# Patient Record
Sex: Female | Born: 1950 | Race: White | Hispanic: No | Marital: Married | Smoking: Current every day smoker
Health system: Southern US, Community
[De-identification: ages and names within clinical notes are randomized; demographics above are authoritative.]

## PROBLEM LIST (undated history)

## (undated) DIAGNOSIS — F419 Anxiety disorder, unspecified: Secondary | ICD-10-CM

## (undated) DIAGNOSIS — M19049 Primary osteoarthritis, unspecified hand: Secondary | ICD-10-CM

## (undated) DIAGNOSIS — I700 Atherosclerosis of aorta: Secondary | ICD-10-CM

## (undated) DIAGNOSIS — E039 Hypothyroidism, unspecified: Secondary | ICD-10-CM

## (undated) DIAGNOSIS — J449 Chronic obstructive pulmonary disease, unspecified: Secondary | ICD-10-CM

## (undated) DIAGNOSIS — I2510 Atherosclerotic heart disease of native coronary artery without angina pectoris: Secondary | ICD-10-CM

## (undated) DIAGNOSIS — F39 Unspecified mood [affective] disorder: Secondary | ICD-10-CM

## (undated) DIAGNOSIS — M810 Age-related osteoporosis without current pathological fracture: Secondary | ICD-10-CM

## (undated) DIAGNOSIS — Z720 Tobacco use: Secondary | ICD-10-CM

## (undated) DIAGNOSIS — M1990 Unspecified osteoarthritis, unspecified site: Secondary | ICD-10-CM

## (undated) DIAGNOSIS — M5430 Sciatica, unspecified side: Secondary | ICD-10-CM

## (undated) DIAGNOSIS — E063 Autoimmune thyroiditis: Secondary | ICD-10-CM

## (undated) HISTORY — DX: Chronic obstructive pulmonary disease, unspecified: J44.9

## (undated) HISTORY — DX: Age-related osteoporosis without current pathological fracture: M81.0

## (undated) HISTORY — DX: Atherosclerotic heart disease of native coronary artery without angina pectoris: I25.10

## (undated) HISTORY — DX: Unspecified osteoarthritis, unspecified site: M19.90

## (undated) HISTORY — DX: Sciatica, unspecified side: M54.30

## (undated) HISTORY — PX: TOTAL THYROIDECTOMY: SHX2547

## (undated) HISTORY — PX: ABDOMINAL HYSTERECTOMY: SHX81

## (undated) HISTORY — DX: Tobacco use: Z72.0

## (undated) HISTORY — DX: Autoimmune thyroiditis: E06.3

## (undated) HISTORY — DX: Anxiety disorder, unspecified: F41.9

## (undated) HISTORY — DX: Unspecified mood (affective) disorder: F39

## (undated) HISTORY — DX: Atherosclerosis of aorta: I70.0

## (undated) HISTORY — DX: Primary osteoarthritis, unspecified hand: M19.049

## (undated) HISTORY — PX: CHOLECYSTECTOMY: SHX55

## (undated) HISTORY — DX: Hypothyroidism, unspecified: E03.9

## (undated) HISTORY — PX: BREAST CYST EXCISION: SHX579

## (undated) HISTORY — PX: CERVICAL DISC SURGERY: SHX588

---

## 2000-07-16 ENCOUNTER — Ambulatory Visit (HOSPITAL_COMMUNITY): Admission: RE | Admit: 2000-07-16 | Discharge: 2000-07-16 | Payer: Self-pay

## 2004-06-10 ENCOUNTER — Emergency Department (HOSPITAL_COMMUNITY): Admission: EM | Admit: 2004-06-10 | Discharge: 2004-06-10 | Payer: Self-pay | Admitting: *Deleted

## 2006-01-25 ENCOUNTER — Emergency Department (HOSPITAL_COMMUNITY): Admission: EM | Admit: 2006-01-25 | Discharge: 2006-01-25 | Payer: Self-pay | Admitting: Emergency Medicine

## 2008-03-02 ENCOUNTER — Emergency Department (HOSPITAL_COMMUNITY): Admission: EM | Admit: 2008-03-02 | Discharge: 2008-03-02 | Payer: Self-pay | Admitting: Emergency Medicine

## 2008-05-09 ENCOUNTER — Encounter: Admission: RE | Admit: 2008-05-09 | Discharge: 2008-05-09 | Payer: Self-pay | Admitting: Family Medicine

## 2008-07-03 ENCOUNTER — Encounter: Admission: RE | Admit: 2008-07-03 | Discharge: 2008-07-03 | Payer: Self-pay | Admitting: Family Medicine

## 2008-07-24 ENCOUNTER — Encounter (INDEPENDENT_AMBULATORY_CARE_PROVIDER_SITE_OTHER): Payer: Self-pay | Admitting: Surgery

## 2008-07-24 ENCOUNTER — Ambulatory Visit (HOSPITAL_COMMUNITY): Admission: RE | Admit: 2008-07-24 | Discharge: 2008-07-25 | Payer: Self-pay | Admitting: Surgery

## 2010-06-02 LAB — CALCIUM: Calcium: 8.4 mg/dL (ref 8.4–10.5)

## 2010-06-03 LAB — URINALYSIS, ROUTINE W REFLEX MICROSCOPIC
Bilirubin Urine: NEGATIVE
Glucose, UA: NEGATIVE mg/dL
Hgb urine dipstick: NEGATIVE
Protein, ur: NEGATIVE mg/dL
Urobilinogen, UA: 0.2 mg/dL (ref 0.0–1.0)

## 2010-06-03 LAB — DIFFERENTIAL
Basophils Absolute: 0 10*3/uL (ref 0.0–0.1)
Basophils Relative: 1 % (ref 0–1)
Eosinophils Absolute: 0.1 10*3/uL (ref 0.0–0.7)
Monocytes Absolute: 0.4 10*3/uL (ref 0.1–1.0)
Monocytes Relative: 6 % (ref 3–12)
Neutro Abs: 3.9 10*3/uL (ref 1.7–7.7)

## 2010-06-03 LAB — CBC
Hemoglobin: 15.1 g/dL — ABNORMAL HIGH (ref 12.0–15.0)
MCHC: 34.8 g/dL (ref 30.0–36.0)
MCV: 93 fL (ref 78.0–100.0)
RBC: 4.65 MIL/uL (ref 3.87–5.11)
RDW: 12.8 % (ref 11.5–15.5)

## 2010-06-03 LAB — BASIC METABOLIC PANEL
CO2: 29 mEq/L (ref 19–32)
Calcium: 9.5 mg/dL (ref 8.4–10.5)
Chloride: 107 mEq/L (ref 96–112)
Creatinine, Ser: 0.97 mg/dL (ref 0.4–1.2)
Glucose, Bld: 91 mg/dL (ref 70–99)
Sodium: 142 mEq/L (ref 135–145)

## 2010-07-08 NOTE — Op Note (Signed)
[name], [name] NO.:  [id number]   MEDICAL RECORD NO.:  [id number]          PATIENT TYPE:  AMB   LOCATION:  DAY                          FACILITY:  [facility]   PHYSICIAN:  Velora Heckler, MD      DATE OF BIRTH:  29-Sep-1950   DATE OF PROCEDURE:  07/24/2008  DATE OF DISCHARGE:                               OPERATIVE REPORT   PREOPERATIVE DIAGNOSIS:  Hashimoto's thyroiditis with compressive  symptoms.   POSTOPERATIVE DIAGNOSIS:  Hashimoto's thyroiditis with compressive  symptoms.   PROCEDURE:  Total thyroidectomy.   SURGEON:  Velora Heckler, MD, FACS   ANESTHESIA:  General per Dr. Brayton Caves.   ESTIMATED BLOOD LOSS:  Minimal.   PREPARATION:  ChloraPrep.   COMPLICATIONS:  None.   INDICATIONS:  The patient is a 60 year old white female from [name],  [place].  The patient had been diagnosed with hypothyroidism in  2006 and placed on Synthroid.  She had noted gradual enlargement of her  thyroid gland with choking sensation.  She noted some neck pain.  She  noted some mild voice changes including hoarseness.  The patient was  seen and evaluated by Dr. Talmage Coin at [facility].  She now  comes to surgery for thyroidectomy.   BODY OF REPORT:  Procedure was done in OR #11 at the [facility].  The patient was brought to the operating room,  placed in supine position on the operating room table.  Following  administration of general anesthesia, the patient is positioned and then  prepped and draped in the usual strict aseptic fashion.  After  ascertaining that an adequate level of anesthesia been achieved, a  Kocher incision was made with a #15 blade.  Dissection was carried down  through subcutaneous tissues and platysma.  Hemostasis was obtained with  electrocautery.  Subplatysmal flaps were elevated cephalad and caudad.  A Mahorner's self-retaining retractor was placed for exposure.  Strap  muscles were incised in  the midline.  Dissection was begun on the left  side of the neck.  Strap muscles were reflected laterally exposing a  relatively normal size but quite firm left thyroid lobe.  There are a  few small areas of nodularity.  Gland was gently mobilized.  Venous  tributaries were divided between small and medium Ligaclips with the  Harmonic scalpel.  Inferior venous tributaries were divided between  medium Ligaclips with the Harmonic scalpel.  Superior pole vessels were  dissected out and divided between small and medium Ligaclips with the  Harmonic scalpel.  Gland is rolled anteriorly.  Parathyroid tissue was  identified and preserved.  Branches of the inferior thyroid artery are  divided between small Ligaclips with the Harmonic scalpel.  Gland is  rolled further anteriorly and the ligament of Allyson Sabal was transected with  electrocautery.  Gland was mobilized up and onto the anterior trachea.  A small pyramidal lobe was dissected out and included with the specimen.  Isthmus was mobilized across the midline.  Dry pack is placed in the  left neck.   Next we turned our attention  to the right thyroid lobe.  Again, the  right thyroid lobe was exceedingly firm, hard, but overall approximately  normal-sized.  Venous tributaries were divided between Ligaclips with  the Harmonic scalpel.  Superior pole vessels were dissected out, ligated  in continuity with 2-0 silk ties and divided with the Harmonic scalpel.  Gland is rolled anteriorly.  Parathyroid tissue was identified and  preserved.  Branches of the inferior thyroid artery are divided between  small Ligaclips with the Harmonic scalpel.  Dissection was carried down  to the root to the ligament of Allyson Sabal which was transected with  electrocautery.  Gland was mobilized up and onto the anterior trachea.  Remaining inferior venous tributaries were divided between medium  Ligaclips with the Harmonic scalpel.  The entire gland is excised.  The  left  superior pole was marked with a suture and the entire gland was  submitted to pathology for review.   Neck was irrigated with warm saline which was evacuated.  Good  hemostasis was noted.  Surgicel was placed in the operative field  bilaterally.  Strap muscles were reapproximated in the midline with  interrupted 3-0 Vicryl sutures.  Platysma was closed with interrupted 3-  0 Vicryl sutures.  Skin was closed with a running 4-0 Monocryl  subcuticular suture.  Wound is washed and dried and Benzoin and Steri-  Strips were applied.  Sterile dressings were applied.  The patient is  awakened from anesthesia and brought to the recovery room in stable  condition.  The patient tolerated the procedure well.      Velora Heckler, MD  Electronically Signed     TMG/MEDQ  D:  07/24/2008  T:  07/24/2008  Job:  [id number]   cc:   Tonita Cong, M.D.   Tamika J. Lazarus Salines, M.D.  Fax: [phone]

## 2011-03-15 ENCOUNTER — Emergency Department (HOSPITAL_COMMUNITY)
Admission: EM | Admit: 2011-03-15 | Discharge: 2011-03-15 | Disposition: A | Discharge status: Home / Self Care | Payer: BC Managed Care – PPO | Source: Home / Self Care | Attending: Family Medicine | Admitting: Family Medicine

## 2011-03-15 ENCOUNTER — Encounter (HOSPITAL_COMMUNITY): Payer: Self-pay | Admitting: *Deleted

## 2011-03-15 DIAGNOSIS — B029 Zoster without complications: Secondary | ICD-10-CM

## 2011-03-15 HISTORY — DX: Hypothyroidism, unspecified: E03.9

## 2011-03-15 MED ORDER — HYDROCODONE-ACETAMINOPHEN 5-500 MG PO TABS: 1.0000 | ORAL_TABLET | Freq: Four times a day (QID) | ORAL | Status: AC | PRN

## 2011-03-15 MED ORDER — CAPSAICIN 0.025 % EX CREA: TOPICAL_CREAM | Freq: Two times a day (BID) | CUTANEOUS | Status: AC

## 2011-03-15 NOTE — ED Provider Notes (Signed)
History     CSN: [id number]  Arrival date & time 03/15/11  1148   First MD Initiated Contact with Patient 03/15/11 1225      Chief Complaint  Patient presents with  . Rash  . Back Pain  . Herpes Zoster    (Consider location/radiation/quality/duration/timing/severity/associated sxs/prior treatment) Patient is a 61 y.o. female presenting with rash and back pain. The history is provided by the patient.  Rash  This is a new problem. The current episode started more than 2 days ago. The problem has been gradually worsening (seen by lmd on wed and given valtrex, sx continue.). The problem is associated with an unknown factor. There has been no fever. The rash is present on the torso. The pain is moderate. The pain has been constant since onset. Associated symptoms include pain. Pertinent negatives include no blisters and no itching.  Back Pain     Past Medical History  Diagnosis Date  . Hypothyroid     Past Surgical History  Procedure Date  . Total thyroidectomy   . Cholecystectomy   . Abdominal hysterectomy   . Cervical disc surgery     History reviewed. No pertinent family history.  History  Substance Use Topics  . Smoking status: Current Everyday Smoker  . Smokeless tobacco: Not on file  . Alcohol Use: No    OB History    Grav Para Term Preterm Abortions TAB SAB Ect Mult Living                  Review of Systems  Constitutional: Negative.   Respiratory: Negative.   Gastrointestinal: Negative.   Musculoskeletal: Positive for back pain.  Skin: Negative for itching and rash.    Allergies  Penicillins  Home Medications   Current Outpatient Rx  Name Route Sig Dispense Refill  . LEVOTHYROXINE SODIUM 112 MCG PO TABS Oral Take 112 mcg by mouth daily.    Marland Kitchen NAPROXEN 500 MG PO TABS Oral Take 500 mg by mouth 2 (two) times daily with a meal.    . VALACYCLOVIR HCL 1 G PO TABS Oral Take 1,000 mg by mouth 3 (three) times daily.     Marland Kitchen CAPSAICIN 0.025 % EX CREA  Topical Apply topically 2 (two) times daily. 60 g 0  . HYDROCODONE-ACETAMINOPHEN 5-500 MG PO TABS Oral Take 1-2 tablets by mouth every 6 (six) hours as needed for pain. 20 tablet 1    BP 130/75  Pulse 87  Temp(Src) 97.7 F (36.5 C) (Oral)  Resp 16  SpO2 96%  Physical Exam  Nursing note and vitals reviewed. Constitutional: She appears well-developed and well-nourished. She appears distressed.  Cardiovascular: Normal rate, normal heart sounds and intact distal pulses.   Pulmonary/Chest: Effort normal and breath sounds normal.  Abdominal: Soft. Bowel sounds are normal. There is no tenderness.  Skin: Skin is warm and dry. No rash noted. There is erythema.       ED Course  Procedures (including critical care time)  Labs Reviewed - No data to display No results found.   1. Herpes zoster       MDM          Barkley Bruns, MD 03/15/11 1320

## 2011-03-15 NOTE — ED Notes (Signed)
Pt with onset of pain right upper back onset Monday burning type pain - seen [facility] Wednesday dx: shingles  Started taking valtrex and naproxen - now with increased pain radiating from right mid to upper back - right axilla - no rash - swelling and increased pain

## 2012-01-06 ENCOUNTER — Other Ambulatory Visit: Payer: Self-pay | Admitting: Family Medicine

## 2012-01-06 DIAGNOSIS — Z1231 Encounter for screening mammogram for malignant neoplasm of breast: Secondary | ICD-10-CM

## 2012-01-11 ENCOUNTER — Ambulatory Visit
Admission: RE | Admit: 2012-01-11 | Discharge: 2012-01-11 | Disposition: A | Discharge status: Home / Self Care | Payer: BC Managed Care – PPO | Source: Ambulatory Visit | Attending: Family Medicine | Admitting: Family Medicine

## 2012-01-11 DIAGNOSIS — Z1231 Encounter for screening mammogram for malignant neoplasm of breast: Secondary | ICD-10-CM

## 2013-12-18 ENCOUNTER — Ambulatory Visit: Payer: BC Managed Care – PPO | Attending: Internal Medicine | Admitting: Physical Therapy

## 2013-12-18 DIAGNOSIS — Z9889 Other specified postprocedural states: Secondary | ICD-10-CM | POA: Insufficient documentation

## 2013-12-18 DIAGNOSIS — Z5189 Encounter for other specified aftercare: Secondary | ICD-10-CM | POA: Diagnosis present

## 2013-12-18 DIAGNOSIS — R531 Weakness: Secondary | ICD-10-CM | POA: Diagnosis not present

## 2013-12-18 DIAGNOSIS — E039 Hypothyroidism, unspecified: Secondary | ICD-10-CM | POA: Insufficient documentation

## 2013-12-18 DIAGNOSIS — M810 Age-related osteoporosis without current pathological fracture: Secondary | ICD-10-CM | POA: Insufficient documentation

## 2013-12-25 ENCOUNTER — Ambulatory Visit: Payer: BC Managed Care – PPO | Attending: Internal Medicine | Admitting: Physical Therapy

## 2013-12-25 DIAGNOSIS — E039 Hypothyroidism, unspecified: Secondary | ICD-10-CM | POA: Diagnosis not present

## 2013-12-25 DIAGNOSIS — Z5189 Encounter for other specified aftercare: Secondary | ICD-10-CM | POA: Diagnosis not present

## 2013-12-25 DIAGNOSIS — M810 Age-related osteoporosis without current pathological fracture: Secondary | ICD-10-CM | POA: Diagnosis not present

## 2013-12-25 DIAGNOSIS — R531 Weakness: Secondary | ICD-10-CM | POA: Diagnosis not present

## 2013-12-25 DIAGNOSIS — Z9889 Other specified postprocedural states: Secondary | ICD-10-CM | POA: Diagnosis not present

## 2014-01-01 ENCOUNTER — Ambulatory Visit: Payer: BC Managed Care – PPO | Admitting: Physical Therapy

## 2014-01-01 DIAGNOSIS — Z5189 Encounter for other specified aftercare: Secondary | ICD-10-CM | POA: Diagnosis not present

## 2014-01-08 ENCOUNTER — Ambulatory Visit: Payer: BC Managed Care – PPO | Admitting: Physical Therapy

## 2014-01-08 DIAGNOSIS — Z5189 Encounter for other specified aftercare: Secondary | ICD-10-CM | POA: Diagnosis not present

## 2016-12-08 ENCOUNTER — Ambulatory Visit
Admission: RE | Admit: 2016-12-08 | Discharge: 2016-12-08 | Disposition: A | Discharge status: Home / Self Care | Payer: BC Managed Care – PPO | Source: Ambulatory Visit | Attending: Family Medicine | Admitting: Family Medicine

## 2016-12-08 ENCOUNTER — Other Ambulatory Visit: Payer: Self-pay | Admitting: Family Medicine

## 2016-12-08 DIAGNOSIS — Z1231 Encounter for screening mammogram for malignant neoplasm of breast: Secondary | ICD-10-CM

## 2017-05-25 ENCOUNTER — Other Ambulatory Visit: Payer: Self-pay | Admitting: Physician Assistant

## 2017-05-25 ENCOUNTER — Ambulatory Visit
Admission: RE | Admit: 2017-05-25 | Discharge: 2017-05-25 | Disposition: A | Discharge status: Home / Self Care | Payer: Medicare Other | Source: Ambulatory Visit | Attending: Physician Assistant | Admitting: Physician Assistant

## 2017-05-25 DIAGNOSIS — R52 Pain, unspecified: Secondary | ICD-10-CM

## 2017-05-26 ENCOUNTER — Other Ambulatory Visit: Payer: Self-pay | Admitting: Physician Assistant

## 2017-05-26 DIAGNOSIS — M542 Cervicalgia: Secondary | ICD-10-CM

## 2017-06-01 ENCOUNTER — Ambulatory Visit
Admission: RE | Admit: 2017-06-01 | Discharge: 2017-06-01 | Disposition: A | Discharge status: Home / Self Care | Payer: Medicare Other | Source: Ambulatory Visit | Attending: Physician Assistant | Admitting: Physician Assistant

## 2017-06-01 DIAGNOSIS — M542 Cervicalgia: Secondary | ICD-10-CM

## 2017-12-07 ENCOUNTER — Other Ambulatory Visit: Payer: Self-pay | Admitting: Internal Medicine

## 2017-12-07 DIAGNOSIS — Z13820 Encounter for screening for osteoporosis: Secondary | ICD-10-CM

## 2018-02-09 ENCOUNTER — Ambulatory Visit
Admission: RE | Admit: 2018-02-09 | Discharge: 2018-02-09 | Disposition: A | Discharge status: Home / Self Care | Payer: Medicare Other | Source: Ambulatory Visit | Attending: Internal Medicine | Admitting: Internal Medicine

## 2018-02-09 DIAGNOSIS — Z13820 Encounter for screening for osteoporosis: Secondary | ICD-10-CM

## 2018-04-18 ENCOUNTER — Other Ambulatory Visit: Payer: Self-pay | Admitting: Acute Care

## 2018-04-18 DIAGNOSIS — Z87891 Personal history of nicotine dependence: Secondary | ICD-10-CM

## 2018-04-18 DIAGNOSIS — F17210 Nicotine dependence, cigarettes, uncomplicated: Principal | ICD-10-CM

## 2018-04-18 DIAGNOSIS — Z122 Encounter for screening for malignant neoplasm of respiratory organs: Secondary | ICD-10-CM

## 2018-05-16 ENCOUNTER — Encounter: Payer: Medicare Other | Admitting: Acute Care

## 2018-05-16 ENCOUNTER — Ambulatory Visit: Payer: Medicare Other

## 2018-07-25 ENCOUNTER — Telehealth: Payer: Self-pay | Admitting: *Deleted

## 2018-07-25 ENCOUNTER — Other Ambulatory Visit: Payer: Self-pay

## 2018-07-25 ENCOUNTER — Encounter: Payer: Self-pay | Admitting: Acute Care

## 2018-07-25 ENCOUNTER — Ambulatory Visit (INDEPENDENT_AMBULATORY_CARE_PROVIDER_SITE_OTHER): Payer: Medicare Other | Admitting: Acute Care

## 2018-07-25 ENCOUNTER — Ambulatory Visit (INDEPENDENT_AMBULATORY_CARE_PROVIDER_SITE_OTHER)
Admission: RE | Admit: 2018-07-25 | Discharge: 2018-07-25 | Disposition: A | Discharge status: Home / Self Care | Payer: Medicare Other | Source: Ambulatory Visit | Attending: Acute Care | Admitting: Acute Care

## 2018-07-25 VITALS — BP 118/60 | HR 79

## 2018-07-25 DIAGNOSIS — F17210 Nicotine dependence, cigarettes, uncomplicated: Secondary | ICD-10-CM

## 2018-07-25 DIAGNOSIS — Z87891 Personal history of nicotine dependence: Secondary | ICD-10-CM

## 2018-07-25 DIAGNOSIS — Z122 Encounter for screening for malignant neoplasm of respiratory organs: Secondary | ICD-10-CM

## 2018-07-25 NOTE — Progress Notes (Signed)
Shared Decision Making Visit Lung Cancer Screening Program [phone])   Eligibility:  Age 68 y.o.  Pack Years Smoking History Calculation 47 pack year smoking history (# packs/per year x # years smoked)  Recent History of coughing up blood  no  Unexplained weight loss? no ( >Than 15 pounds within the last 6 months )  Prior History Lung / other cancer no (Diagnosis within the last 5 years already requiring surveillance chest CT Scans).  Smoking Status Current Smoker  Former Smokers: Years since quit: NA  Quit Date: NA  Visit Components:  Discussion included one or more decision making aids. yes  Discussion included risk/benefits of screening. yes  Discussion included potential follow up diagnostic testing for abnormal scans. yes  Discussion included meaning and risk of over diagnosis. yes  Discussion included meaning and risk of False Positives. yes  Discussion included meaning of total radiation exposure. yes  Counseling Included:  Importance of adherence to annual lung cancer LDCT screening. yes  Impact of comorbidities on ability to participate in the program. yes  Ability and willingness to under diagnostic treatment. yes  Smoking Cessation Counseling:  Current Smokers:   Discussed importance of smoking cessation. yes  Information about tobacco cessation classes and interventions provided to patient. yes  Patient provided with "ticket" for LDCT Scan. yes  Symptomatic Patient. no  Counseling  Diagnosis Code: Tobacco Use Z72.0  Asymptomatic Patient yes  Counseling (Intermediate counseling: > three minutes counseling) O1157  Former Smokers:   Discussed the importance of maintaining cigarette abstinence. yes  Diagnosis Code: Personal History of Nicotine Dependence. W62.035  Information about tobacco cessation classes and interventions provided to patient. Yes  Patient provided with "ticket" for LDCT Scan. yes  Written Order for Lung Cancer  Screening with LDCT placed in Epic. Yes (CT Chest Lung Cancer Screening Low Dose W/O CM) [id number] Z12.2-Screening of respiratory organs Z87.891-Personal history of nicotine dependence  I have spent 25 minutes of face to face time with Ms. Bodenheimer discussing the risks and benefits of lung cancer screening. We viewed a power point together that explained in detail the above noted topics. We paused at intervals to allow for questions to be asked and answered to ensure understanding.We discussed that the single most powerful action that she can take to decrease her risk of developing lung cancer is to quit smoking. We discussed whether or not she is ready to commit to setting a quit date. We discussed options for tools to aid in quitting smoking including nicotine replacement therapy, non-nicotine medications, support groups, Quit Smart classes, and behavior modification. We discussed that often times setting smaller, more achievable goals, such as eliminating 1 cigarette a day for a week and then 2 cigarettes a day for a week can be helpful in slowly decreasing the number of cigarettes smoked. This allows for a sense of accomplishment as well as providing a clinical benefit. I gave her the " Be Stronger Than Your Excuses" card with contact information for community resources, classes, free nicotine replacement therapy, and access to mobile apps, text messaging, and on-line smoking cessation help. I have also given her my card and contact information in the event she needs to contact me. We discussed the time and location of the scan, and that either Abigail Miyamoto RN or I will call with the results within 24-48 hours of receiving them. I have offered her  a copy of the power point we viewed  as a resource in the event they need reinforcement of  the concepts we discussed today in the office. The patient verbalized understanding of all of  the above and had no further questions upon leaving the office. They have my  contact information in the event they have any further questions.  I spent 3 minutes counseling on smoking cessation and the health risks of continued tobacco abuse.  I explained to the patient that there has been a high incidence of coronary artery disease noted on these exams. I explained that this is a non-gated exam therefore degree or severity cannot be determined. This patient is not on statin therapy. I have asked the patient to follow-up with their PCP regarding any incidental finding of coronary artery disease and management with diet or medication as their PCP  feels is clinically indicated. The patient verbalized understanding of the above and had no further questions upon completion of the visit.   BP 118/60 (BP Location: Left Arm, Cuff Size: Normal)   Pulse 79   SpO2 98%       Bevelyn NgoSarah F Anias Bartol, NP 07/25/2018 2:05 PM

## 2018-07-25 NOTE — Telephone Encounter (Signed)

## 2018-07-29 ENCOUNTER — Other Ambulatory Visit: Payer: Self-pay | Admitting: Acute Care

## 2018-07-29 DIAGNOSIS — Z87891 Personal history of nicotine dependence: Secondary | ICD-10-CM

## 2018-07-29 DIAGNOSIS — Z122 Encounter for screening for malignant neoplasm of respiratory organs: Secondary | ICD-10-CM

## 2018-07-29 DIAGNOSIS — F17210 Nicotine dependence, cigarettes, uncomplicated: Secondary | ICD-10-CM

## 2018-09-14 ENCOUNTER — Telehealth: Payer: Medicare Other | Admitting: Family

## 2018-09-14 DIAGNOSIS — J019 Acute sinusitis, unspecified: Secondary | ICD-10-CM

## 2018-09-14 MED ORDER — DOXYCYCLINE HYCLATE 100 MG PO TABS: 100.0000 mg | ORAL_TABLET | Freq: Two times a day (BID) | ORAL | 0 refills | Status: DC

## 2018-09-14 NOTE — Progress Notes (Signed)
We are sorry that you are not feeling well.  Here is how we plan to help!  Based on what you have shared with me it looks like you have sinusitis.  Sinusitis is inflammation and infection in the sinus cavities of the head.  Based on your presentation I believe you most likely have Acute Bacterial Sinusitis.  This is an infection caused by bacteria and is treated with antibiotics. I have prescribed Doxycycline 100mg  by mouth twice a day for 10 days. You may use an oral decongestant such as Mucinex D or if you have glaucoma or high blood pressure use plain Mucinex. Saline nasal spray help and can safely be used as often as needed for congestion.  If you develop worsening sinus pain, fever or notice severe headache and vision changes, or if symptoms are not better after completion of antibiotic, please schedule an appointment with a health care provider.    Your current symptoms could be consistent with the coronavirus.  Many health care providers can now test patients at their office but not all are.  [facility] has multiple testing sites. For information on our COVID testing locations and hours go to [email or web address]  Please quarantine yourself while awaiting your test results.  Approximately 5 minutes was spent documenting and reviewing patient's chart.   Sinus infections are not as easily transmitted as other respiratory infection, however we still recommend that you avoid close contact with loved ones, especially the very young and elderly.  Remember to wash your hands thoroughly throughout the day as this is the number one way to prevent the spread of infection!  Home Care:  Only take medications as instructed by your medical team.  Complete the entire course of an antibiotic.  Do not take these medications with alcohol.  A steam or ultrasonic humidifier can help congestion.  You can place a towel over your head and breathe in the steam from hot water coming from  a faucet.  Avoid close contacts especially the very young and the elderly.  Cover your mouth when you cough or sneeze.  Always remember to wash your hands.  Get Help Right Away If:  You develop worsening fever or sinus pain.  You develop a severe head ache or visual changes.  Your symptoms persist after you have completed your treatment plan.  Make sure you  Understand these instructions.  Will watch your condition.  Will get help right away if you are not doing well or get worse.  Your e-visit answers were reviewed by a board certified advanced clinical practitioner to complete your personal care plan.  Depending on the condition, your plan could have included both over the counter or prescription medications.  If there is a problem please reply  once you have received a response from your provider.  Your safety is important to [place].  If you have drug allergies check your prescription carefully.    You can use MyChart to ask questions about today's visit, request a non-urgent call back, or ask for a work or school excuse for 24 hours related to this e-Visit. If it has been greater than 24 hours you will need to follow up with your provider, or enter a new e-Visit to address those concerns.  You will get an e-mail in the next two days asking about your experience.  I hope that your e-visit has been valuable and will speed your recovery. Thank you for using e-visits.

## 2018-12-01 ENCOUNTER — Other Ambulatory Visit: Payer: Self-pay | Admitting: Family Medicine

## 2018-12-01 DIAGNOSIS — Z1231 Encounter for screening mammogram for malignant neoplasm of breast: Secondary | ICD-10-CM

## 2019-01-16 ENCOUNTER — Other Ambulatory Visit: Payer: Self-pay

## 2019-01-16 ENCOUNTER — Ambulatory Visit
Admission: RE | Admit: 2019-01-16 | Discharge: 2019-01-16 | Disposition: A | Discharge status: Home / Self Care | Payer: Medicare Other | Source: Ambulatory Visit | Attending: Family Medicine | Admitting: Family Medicine

## 2019-01-16 DIAGNOSIS — Z1231 Encounter for screening mammogram for malignant neoplasm of breast: Secondary | ICD-10-CM

## 2019-03-08 ENCOUNTER — Ambulatory Visit
Admission: EM | Admit: 2019-03-08 | Discharge: 2019-03-08 | Disposition: A | Discharge status: Home / Self Care | Payer: Medicare PPO | Attending: Physician Assistant | Admitting: Physician Assistant

## 2019-03-08 ENCOUNTER — Other Ambulatory Visit: Payer: Self-pay

## 2019-03-08 DIAGNOSIS — L03221 Cellulitis of neck: Secondary | ICD-10-CM | POA: Diagnosis not present

## 2019-03-08 DIAGNOSIS — J3489 Other specified disorders of nose and nasal sinuses: Secondary | ICD-10-CM

## 2019-03-08 MED ORDER — DOXYCYCLINE HYCLATE 100 MG PO CAPS: 100.0000 mg | ORAL_CAPSULE | Freq: Two times a day (BID) | ORAL | 0 refills | Status: DC

## 2019-03-08 NOTE — ED Triage Notes (Signed)
Pt c/o a lump behind lt ear since yesterday. C/o pain to ear and neck. Redness and tenderness noted

## 2019-03-08 NOTE — ED Provider Notes (Signed)
[facility]    CSN: [id number] Arrival date & time: 03/08/19  0858      History   Chief Complaint Chief Complaint  Patient presents with  . lump behind ear    HPI [name] is a 69 y.o. female.   69 year old female comes in for 2 day history of pain behind the ear. States noticed area when eating yesterday. Had some pain with chewing, and was when she felt lump behind the ear, that was erythematous. Has not noticed increase in size since. Denies spreading erythema. Patient states has had 1 week of rhinorrhea, nasal congestion, cough. Intermittent sinus pressure. Denies fever, chills, body aches. Denies shortness of breath, loss of taste/smell. Denies dental pain.  Denies sick/COVID exposure. Has been taking otc cold medicine with good relief. Tool ibuprofen with some relief of left ear pain.      Past Medical History:  Diagnosis Date  . Hypothyroid     There are no problems to display for this patient.   Past Surgical History:  Procedure Laterality Date  . ABDOMINAL HYSTERECTOMY    . BREAST CYST EXCISION Bilateral   . CERVICAL DISC SURGERY    . CHOLECYSTECTOMY    . TOTAL THYROIDECTOMY      OB History   No obstetric history on file.      Home Medications    Prior to Admission medications   Medication Sig Start Date End Date Taking? Authorizing Provider  doxycycline (VIBRAMYCIN) 100 MG capsule Take 1 capsule (100 mg total) by mouth 2 (two) times daily. 03/08/19   Tasia Catchings, Ariyanah Aguado V, PA-C  levothyroxine (SYNTHROID, LEVOTHROID) 112 MCG tablet Take 112 mcg by mouth daily.    [provider]  naproxen (NAPROSYN) 500 MG tablet Take 500 mg by mouth 2 (two) times daily with a meal.    [provider]  valACYclovir (VALTREX) 1000 MG tablet Take 1,000 mg by mouth 3 (three) times daily.     [provider]    Family History No family history on file.  Social History Social History   Tobacco Use  . Smoking status: Current Every  Day Smoker    Packs/day: 1.00    Years: 47.00    Pack years: 47.00    Types: Cigarettes  . Smokeless tobacco: Never Used  Substance Use Topics  . Alcohol use: No  . Drug use: No     Allergies   Penicillins   Review of Systems Review of Systems  Reason unable to perform ROS: See HPI as above.     Physical Exam Triage Vital Signs ED Triage Vitals  Enc Vitals Group     BP 03/08/19 0908 131/81     Pulse Rate 03/08/19 0908 93     Resp 03/08/19 0908 16     Temp 03/08/19 0908 98.2 F (36.8 C)     Temp Source 03/08/19 0908 Oral     SpO2 03/08/19 0908 94 %     Weight --      Height --      Head Circumference --      Peak Flow --      Pain Score 03/08/19 0925 4     Pain Loc --      Pain Edu? --      Excl. in [place]? --    No data found.  Updated Vital Signs BP 131/81 (BP Location: Left Arm)   Pulse 93   Temp 98.2 F (36.8 C) (Oral)  Resp 16   SpO2 94%   Physical Exam Constitutional:      General: She is not in acute distress.    Appearance: Normal appearance. She is not ill-appearing, toxic-appearing or diaphoretic.  HENT:     Head: Normocephalic and atraumatic.      Mouth/Throat:     Mouth: Mucous membranes are moist.     Pharynx: Oropharynx is clear. Uvula midline. No posterior oropharyngeal erythema or uvula swelling.  Cardiovascular:     Rate and Rhythm: Normal rate and regular rhythm.     Heart sounds: Normal heart sounds. No murmur. No friction rub. No gallop.   Pulmonary:     Effort: Pulmonary effort is normal. No accessory muscle usage, prolonged expiration, respiratory distress or retractions.     Comments: Lungs clear to auscultation without adventitious lung sounds. Musculoskeletal:     Cervical back: Normal range of motion and neck supple.  Neurological:     General: No focal deficit present.     Mental Status: She is alert and oriented to person, place, and time.      UC Treatments / Results  Labs (all labs ordered are listed, but only  abnormal results are displayed) Labs Reviewed - No data to display  EKG   Radiology No results found.  Procedures Procedures (including critical care time)  Medications Ordered in UC Medications - No data to display  Initial Impression / Assessment and Plan / UC Course  I have reviewed the triage vital signs and the nursing notes.  Pertinent labs & imaging results that were available during my care of the patient were reviewed by me and considered in my medical decision making (see chart for details).     Cellulitis without signs of abscess at this time. Doxycycline as directed. No obvious adenopathy felt, lower suspicion for lymphadenitis/lymphaginitis at this time. Warm compress to the area. Discussed given URI symptoms, cannot rule out COVID. Patient declined COVID testing at this time. Will have patient quarantine until symptoms resolves. Return precautions given. Patient expresses understanding and agrees to plan.  Final Clinical Impressions(s) / UC Diagnoses   Final diagnoses:  Cellulitis of neck  Sinus pressure    ED Prescriptions    Medication Sig Dispense Auth. Provider   doxycycline (VIBRAMYCIN) 100 MG capsule Take 1 capsule (100 mg total) by mouth 2 (two) times daily. 20 capsule Belinda Fisher, PA-C     PDMP not reviewed this encounter.   Belinda Fisher, PA-C 03/08/19 1002

## 2019-03-08 NOTE — Discharge Instructions (Signed)
Start doxycycline as directed. Warm compress to the area, monitor for spreading redness, warmth, fever. As discussed, given current sinus symptoms, please quarantine until your symptoms resolve. Doxycycline will also cover a sinus infection.

## 2019-04-03 ENCOUNTER — Ambulatory Visit: Payer: Medicare PPO | Attending: Internal Medicine

## 2019-04-03 DIAGNOSIS — Z23 Encounter for immunization: Secondary | ICD-10-CM | POA: Insufficient documentation

## 2019-04-03 NOTE — Progress Notes (Signed)
   Covid-19 Vaccination Clinic  Name:  [name]    MRN: [id number] DOB: 1950-09-27  04/03/2019  [name] was observed post Covid-19 immunization for 30 minutes based on pre-vaccination screening without incidence. She was provided with Vaccine Information Sheet and instruction to access the V-Safe system.   [name] was instructed to call 911 with any severe reactions post vaccine: Marland Kitchen Difficulty breathing  . Swelling of your face and throat  . A fast heartbeat  . A bad rash all over your body  . Dizziness and weakness    Immunizations Administered    Name Date Dose VIS Date Route   Pfizer COVID-19 Vaccine 04/03/2019  9:59 AM 0.3 mL 02/03/2019 Intramuscular   Manufacturer: [employer], [employer]   Lot: [id number]   NDC: [address]-6

## 2019-04-21 ENCOUNTER — Ambulatory Visit: Payer: Medicare PPO

## 2019-04-26 ENCOUNTER — Ambulatory Visit: Payer: Medicare PPO | Attending: Internal Medicine

## 2019-04-26 DIAGNOSIS — Z23 Encounter for immunization: Secondary | ICD-10-CM | POA: Insufficient documentation

## 2019-04-26 NOTE — Progress Notes (Signed)
   Covid-19 Vaccination Clinic  Name:  [name]    MRN: [id number] DOB: August 17, 1950  04/26/2019  [name] was observed post Covid-19 immunization for 30 minutes based on pre-vaccination screening without incident. She was provided with Vaccine Information Sheet and instruction to access the V-Safe system.   [name] was instructed to call 911 with any severe reactions post vaccine: Marland Kitchen Difficulty breathing  . Swelling of face and throat  . A fast heartbeat  . A bad rash all over body  . Dizziness and weakness   Immunizations Administered    Name Date Dose VIS Date Route   Pfizer COVID-19 Vaccine 04/26/2019 10:02 AM 0.3 mL 02/03/2019 Intramuscular   Manufacturer: [employer], [employer]   Lot: [id number]   NDC: [address]-3

## 2019-06-06 ENCOUNTER — Ambulatory Visit
Admission: EM | Admit: 2019-06-06 | Discharge: 2019-06-06 | Disposition: A | Discharge status: Home / Self Care | Payer: Medicare PPO

## 2019-06-06 DIAGNOSIS — R252 Cramp and spasm: Secondary | ICD-10-CM | POA: Diagnosis not present

## 2019-06-06 DIAGNOSIS — M545 Low back pain, unspecified: Secondary | ICD-10-CM

## 2019-06-06 MED ORDER — TIZANIDINE HCL 2 MG PO TABS: 2.0000 mg | ORAL_TABLET | Freq: Three times a day (TID) | ORAL | 0 refills | Status: DC | PRN

## 2019-06-06 MED ORDER — METHYLPREDNISOLONE 4 MG PO TBPK: ORAL_TABLET | ORAL | 0 refills | Status: DC

## 2019-06-06 NOTE — ED Provider Notes (Signed)
[facility]    CSN: [id number] Arrival date & time: 06/06/19  1214      History   Chief Complaint Chief Complaint  Patient presents with  . Back Pain    HPI [name] is a 69 y.o. female.   69 year old female comes in for 3-day history of left back pain that is now traveling to the left buttock/thigh.  Denies injury/trauma.  States back pain is now mostly relieved, and has pain more to the buttock and thigh.  Pain is constant, cramping in sensation, worse with movement.  Has tingling to the posterior and anterior thigh that is intermittent.  Denies joint swelling, erythema, warmth.  Denies loss of bladder or bowel control.  Ibuprofen, tramadol without relief.   Denies significant fluid loss such as vomiting, diarrhea, excessive sweating.  Does not take any blood pressure medication.     Past Medical History:  Diagnosis Date  . Hypothyroid     There are no problems to display for this patient.   Past Surgical History:  Procedure Laterality Date  . ABDOMINAL HYSTERECTOMY    . BREAST CYST EXCISION Bilateral   . CERVICAL DISC SURGERY    . CHOLECYSTECTOMY    . TOTAL THYROIDECTOMY      OB History   No obstetric history on file.      Home Medications    Prior to Admission medications   Medication Sig Start Date End Date Taking? Authorizing Provider  citalopram (CELEXA) 20 MG tablet Take 20 mg by mouth daily.   Yes [provider]  levothyroxine (SYNTHROID, LEVOTHROID) 112 MCG tablet Take 112 mcg by mouth daily.    [provider]  methylPREDNISolone (MEDROL DOSEPAK) 4 MG TBPK tablet Follow pack instruction 06/06/19   Tasia Catchings, Makenzey Nanni V, PA-C  tiZANidine (ZANAFLEX) 2 MG tablet Take 1 tablet (2 mg total) by mouth every 8 (eight) hours as needed for muscle spasms. 06/06/19   Ok Edwards, PA-C    Family History History reviewed. No pertinent family history.  Social History Social History   Tobacco Use  . Smoking status: Current Every Day  Smoker    Packs/day: 1.00    Years: 47.00    Pack years: 47.00    Types: Cigarettes  . Smokeless tobacco: Never Used  Substance Use Topics  . Alcohol use: No  . Drug use: No     Allergies   Penicillins   Review of Systems Review of Systems  Reason unable to perform ROS: See HPI as above.     Physical Exam Triage Vital Signs ED Triage Vitals  Enc Vitals Group     BP 06/06/19 1228 (!) 125/56     Pulse Rate 06/06/19 1228 77     Resp 06/06/19 1228 16     Temp 06/06/19 1228 97.7 F (36.5 C)     Temp Source 06/06/19 1228 Oral     SpO2 06/06/19 1228 97 %     Weight --      Height --      Head Circumference --      Peak Flow --      Pain Score 06/06/19 1235 7     Pain Loc --      Pain Edu? --      Excl. in [place]? --    No data found.  Updated Vital Signs BP (!) 125/56 (BP Location: Left Arm)   Pulse 77   Temp 97.7 F (36.5 C) (Oral)   Resp 16  SpO2 97%   Physical Exam Constitutional:      General: She is not in acute distress.    Appearance: She is well-developed. She is not diaphoretic.  HENT:     Head: Normocephalic and atraumatic.  Eyes:     Conjunctiva/sclera: Conjunctivae normal.     Pupils: Pupils are equal, round, and reactive to light.  Cardiovascular:     Rate and Rhythm: Normal rate and regular rhythm.     Heart sounds: Normal heart sounds. No murmur. No friction rub. No gallop.   Pulmonary:     Effort: Pulmonary effort is normal. No accessory muscle usage or respiratory distress.     Breath sounds: Normal breath sounds. No stridor. No decreased breath sounds, wheezing, rhonchi or rales.  Musculoskeletal:     Comments: No swelling, erythema, deformity, rash.  No tenderness to palpation of the spinous processes.  Tenderness to palpation of left buttock/low lumbar/sacral region.  No tenderness to palpation of lateral/anterior hip.  Full range of motion of back and hips, though active range of motion causes leg cramping.  Some tenderness to palpation  of the posterior/lateral thigh.  Sensation intact.  Negative straight leg raise.  No cords felt to the posterior thigh.  Skin:    General: Skin is warm and dry.  Neurological:     Mental Status: She is alert and oriented to person, place, and time.     UC Treatments / Results  Labs (all labs ordered are listed, but only abnormal results are displayed) Labs Reviewed - No data to display  EKG   Radiology No results found.  Procedures Procedures (including critical care time)  Medications Ordered in UC Medications - No data to display  Initial Impression / Assessment and Plan / UC Course  I have reviewed the triage vital signs and the nursing notes.  Pertinent labs & imaging results that were available during my care of the patient were reviewed by me and considered in my medical decision making (see chart for details).    Thigh cramps, worse with ROM. No obvious muscle spasms felt. No fluid loss, not on antihypertensives, lower suspicion for electrolyte imbalance.  At this time, will treat symptomatically with Medrol pack, muscle relaxers.  Return precautions given.  Patient expresses understanding and agrees to plan.  Final Clinical Impressions(s) / UC Diagnoses   Final diagnoses:  Leg cramping  Acute left-sided low back pain without sciatica    ED Prescriptions    Medication Sig Dispense Auth. Provider   methylPREDNISolone (MEDROL DOSEPAK) 4 MG TBPK tablet Follow pack instruction 21 tablet Daliah Chaudoin V, PA-C   tiZANidine (ZANAFLEX) 2 MG tablet Take 1 tablet (2 mg total) by mouth every 8 (eight) hours as needed for muscle spasms. 15 tablet Belinda Fisher, PA-C     I have reviewed the PDMP during this encounter.   Belinda Fisher, PA-C 06/06/19 1305

## 2019-06-06 NOTE — ED Triage Notes (Signed)
Pt c/o lt side lower back pain radiating to lt buttocks and lt upper thigh since Saturday. Denies injury. States taken OTC meds and applied ice and heat with no relief.

## 2019-06-06 NOTE — Discharge Instructions (Signed)
Start medrol pack as directed. Tizanidine as needed, this can make you drowsy, so do not take if you are going to drive, operate heavy machinery, or make important decisions. Warm compresses, stay hydrated, bananas for potassium. Follow up with PCP if symptoms not improving.

## 2019-06-19 ENCOUNTER — Other Ambulatory Visit: Payer: Self-pay | Admitting: Family Medicine

## 2019-06-19 ENCOUNTER — Ambulatory Visit
Admission: RE | Admit: 2019-06-19 | Discharge: 2019-06-19 | Disposition: A | Discharge status: Home / Self Care | Payer: Medicare PPO | Source: Ambulatory Visit | Attending: Family Medicine | Admitting: Family Medicine

## 2019-06-19 DIAGNOSIS — M545 Low back pain, unspecified: Secondary | ICD-10-CM

## 2019-07-28 ENCOUNTER — Other Ambulatory Visit: Payer: Self-pay | Admitting: Family Medicine

## 2019-07-28 DIAGNOSIS — M5432 Sciatica, left side: Secondary | ICD-10-CM

## 2019-07-30 ENCOUNTER — Ambulatory Visit
Admission: RE | Admit: 2019-07-30 | Discharge: 2019-07-30 | Disposition: A | Discharge status: Home / Self Care | Payer: Medicare PPO | Source: Ambulatory Visit | Attending: Family Medicine | Admitting: Family Medicine

## 2019-07-30 DIAGNOSIS — M5126 Other intervertebral disc displacement, lumbar region: Secondary | ICD-10-CM | POA: Diagnosis not present

## 2019-07-30 DIAGNOSIS — M5432 Sciatica, left side: Secondary | ICD-10-CM

## 2019-08-08 ENCOUNTER — Other Ambulatory Visit: Payer: Self-pay | Admitting: Neurological Surgery

## 2019-08-08 DIAGNOSIS — R030 Elevated blood-pressure reading, without diagnosis of hypertension: Secondary | ICD-10-CM | POA: Diagnosis not present

## 2019-08-08 DIAGNOSIS — M5126 Other intervertebral disc displacement, lumbar region: Secondary | ICD-10-CM | POA: Diagnosis not present

## 2019-08-15 ENCOUNTER — Other Ambulatory Visit: Payer: Self-pay

## 2019-08-15 ENCOUNTER — Ambulatory Visit
Admission: RE | Admit: 2019-08-15 | Discharge: 2019-08-15 | Disposition: A | Discharge status: Home / Self Care | Payer: Medicare PPO | Source: Ambulatory Visit | Attending: Neurological Surgery | Admitting: Neurological Surgery

## 2019-08-15 DIAGNOSIS — M5116 Intervertebral disc disorders with radiculopathy, lumbar region: Secondary | ICD-10-CM | POA: Diagnosis not present

## 2019-08-15 DIAGNOSIS — M5126 Other intervertebral disc displacement, lumbar region: Secondary | ICD-10-CM

## 2019-08-15 MED ORDER — IOPAMIDOL (ISOVUE-M 200) INJECTION 41%
1.0000 mL | Freq: Once | INTRAMUSCULAR | Status: AC
  Administered 2019-08-15: 1 mL via EPIDURAL

## 2019-08-15 MED ORDER — METHYLPREDNISOLONE ACETATE 40 MG/ML INJ SUSP (RADIOLOG
120.0000 mg | Freq: Once | INTRAMUSCULAR | Status: AC
  Administered 2019-08-15: 120 mg via EPIDURAL

## 2019-08-15 NOTE — Discharge Instructions (Signed)

## 2019-08-26 ENCOUNTER — Other Ambulatory Visit: Payer: Medicare PPO

## 2019-09-05 DIAGNOSIS — M5126 Other intervertebral disc displacement, lumbar region: Secondary | ICD-10-CM | POA: Diagnosis not present

## 2019-09-05 DIAGNOSIS — M7061 Trochanteric bursitis, right hip: Secondary | ICD-10-CM | POA: Diagnosis not present

## 2019-09-08 DIAGNOSIS — M5116 Intervertebral disc disorders with radiculopathy, lumbar region: Secondary | ICD-10-CM | POA: Diagnosis not present

## 2019-09-08 DIAGNOSIS — M5126 Other intervertebral disc displacement, lumbar region: Secondary | ICD-10-CM | POA: Diagnosis not present

## 2019-10-02 DIAGNOSIS — R82998 Other abnormal findings in urine: Secondary | ICD-10-CM | POA: Diagnosis not present

## 2019-10-02 DIAGNOSIS — F39 Unspecified mood [affective] disorder: Secondary | ICD-10-CM | POA: Diagnosis not present

## 2019-10-02 DIAGNOSIS — M810 Age-related osteoporosis without current pathological fracture: Secondary | ICD-10-CM | POA: Diagnosis not present

## 2019-10-02 DIAGNOSIS — I700 Atherosclerosis of aorta: Secondary | ICD-10-CM | POA: Diagnosis not present

## 2019-10-02 DIAGNOSIS — M5416 Radiculopathy, lumbar region: Secondary | ICD-10-CM | POA: Diagnosis not present

## 2019-10-02 DIAGNOSIS — R7309 Other abnormal glucose: Secondary | ICD-10-CM | POA: Diagnosis not present

## 2019-10-02 DIAGNOSIS — F411 Generalized anxiety disorder: Secondary | ICD-10-CM | POA: Diagnosis not present

## 2019-10-02 DIAGNOSIS — Z720 Tobacco use: Secondary | ICD-10-CM | POA: Diagnosis not present

## 2019-10-02 DIAGNOSIS — E039 Hypothyroidism, unspecified: Secondary | ICD-10-CM | POA: Diagnosis not present

## 2019-10-19 DIAGNOSIS — Q828 Other specified congenital malformations of skin: Secondary | ICD-10-CM | POA: Diagnosis not present

## 2019-10-19 DIAGNOSIS — L821 Other seborrheic keratosis: Secondary | ICD-10-CM | POA: Diagnosis not present

## 2019-10-19 DIAGNOSIS — L820 Inflamed seborrheic keratosis: Secondary | ICD-10-CM | POA: Diagnosis not present

## 2019-10-19 DIAGNOSIS — D485 Neoplasm of uncertain behavior of skin: Secondary | ICD-10-CM | POA: Diagnosis not present

## 2019-10-19 DIAGNOSIS — L814 Other melanin hyperpigmentation: Secondary | ICD-10-CM | POA: Diagnosis not present

## 2019-10-19 DIAGNOSIS — D692 Other nonthrombocytopenic purpura: Secondary | ICD-10-CM | POA: Diagnosis not present

## 2019-10-19 DIAGNOSIS — C44319 Basal cell carcinoma of skin of other parts of face: Secondary | ICD-10-CM | POA: Diagnosis not present

## 2019-10-19 DIAGNOSIS — D1801 Hemangioma of skin and subcutaneous tissue: Secondary | ICD-10-CM | POA: Diagnosis not present

## 2019-10-19 DIAGNOSIS — L570 Actinic keratosis: Secondary | ICD-10-CM | POA: Diagnosis not present

## 2019-10-19 DIAGNOSIS — D225 Melanocytic nevi of trunk: Secondary | ICD-10-CM | POA: Diagnosis not present

## 2019-11-02 DIAGNOSIS — Z716 Tobacco abuse counseling: Secondary | ICD-10-CM | POA: Diagnosis not present

## 2019-11-02 DIAGNOSIS — Z720 Tobacco use: Secondary | ICD-10-CM | POA: Diagnosis not present

## 2019-11-02 DIAGNOSIS — F17200 Nicotine dependence, unspecified, uncomplicated: Secondary | ICD-10-CM | POA: Diagnosis not present

## 2019-11-02 DIAGNOSIS — E039 Hypothyroidism, unspecified: Secondary | ICD-10-CM | POA: Diagnosis not present

## 2019-11-02 DIAGNOSIS — M810 Age-related osteoporosis without current pathological fracture: Secondary | ICD-10-CM | POA: Diagnosis not present

## 2019-11-02 DIAGNOSIS — E063 Autoimmune thyroiditis: Secondary | ICD-10-CM | POA: Diagnosis not present

## 2019-11-02 DIAGNOSIS — Z23 Encounter for immunization: Secondary | ICD-10-CM | POA: Diagnosis not present

## 2019-11-20 DIAGNOSIS — C44319 Basal cell carcinoma of skin of other parts of face: Secondary | ICD-10-CM | POA: Diagnosis not present

## 2020-01-02 DIAGNOSIS — M5126 Other intervertebral disc displacement, lumbar region: Secondary | ICD-10-CM | POA: Diagnosis not present

## 2020-01-03 DIAGNOSIS — Z961 Presence of intraocular lens: Secondary | ICD-10-CM | POA: Diagnosis not present

## 2020-01-03 DIAGNOSIS — H04123 Dry eye syndrome of bilateral lacrimal glands: Secondary | ICD-10-CM | POA: Diagnosis not present

## 2020-01-03 DIAGNOSIS — H16103 Unspecified superficial keratitis, bilateral: Secondary | ICD-10-CM | POA: Diagnosis not present

## 2020-01-06 ENCOUNTER — Telehealth: Payer: Medicare PPO | Admitting: Emergency Medicine

## 2020-01-06 DIAGNOSIS — J329 Chronic sinusitis, unspecified: Secondary | ICD-10-CM | POA: Diagnosis not present

## 2020-01-06 MED ORDER — DOXYCYCLINE HYCLATE 100 MG PO TABS: 100.0000 mg | ORAL_TABLET | Freq: Two times a day (BID) | ORAL | 0 refills | Status: DC

## 2020-01-06 NOTE — Progress Notes (Signed)
Time spent: 10 min  We are sorry that you are not feeling well.  Here is how we plan to help!  Based on what you have shared with me it looks like you have sinusitis.  Sinusitis is inflammation and infection in the sinus cavities of the head.  Based on your presentation I believe you most likely have Acute Bacterial Sinusitis.  This is an infection caused by bacteria and is treated with antibiotics. I have prescribed Augmentin 875mg /125mg  one tablet twice daily with food, for 7 days. You may use an oral decongestant such as Mucinex D or if you have glaucoma or high blood pressure use plain Mucinex. Saline nasal spray help and can safely be used as often as needed for congestion.  If you develop worsening sinus pain, fever or notice severe headache and vision changes, or if symptoms are not better after completion of antibiotic, please schedule an appointment with a health care provider.    Other remedies include: daily allergy medicine (allegra, zyrtec, claritin), intranasal sprays twice daily (flonase), cool air humidifier.  Sinus infections are not as easily transmitted as other respiratory infection, however we still recommend that you avoid close contact with loved ones, especially the very young and elderly.  Remember to wash your hands thoroughly throughout the day as this is the number one way to prevent the spread of infection!  Home Care:  Only take medications as instructed by your medical team.  Complete the entire course of an antibiotic.  Do not take these medications with alcohol.  A steam or ultrasonic humidifier can help congestion.  You can place a towel over your head and breathe in the steam from hot water coming from a faucet.  Avoid close contacts especially the very young and the elderly.  Cover your mouth when you cough or sneeze.  Always remember to wash your hands.  Get Help Right Away If:  You develop worsening fever or sinus pain.  You develop a severe head  ache or visual changes.  Your symptoms persist after you have completed your treatment plan.  Make sure you  Understand these instructions.  Will watch your condition.  Will get help right away if you are not doing well or get worse.  Your e-visit answers were reviewed by a board certified advanced clinical practitioner to complete your personal care plan.  Depending on the condition, your plan could have included both over the counter or prescription medications.  If there is a problem please reply  once you have received a response from your provider.  Your safety is important to .  If you have drug allergies check your prescription carefully.    You can use MyChart to ask questions about today's visit, request a non-urgent call back, or ask for a work or school excuse for 24 hours related to this e-Visit. If it has been greater than 24 hours you will need to follow up with your provider, or enter a new e-Visit to address those concerns.  You will get an e-mail in the next two days asking about your experience.  I hope that your e-visit has been valuable and will speed your recovery. Thank you for using e-visits.

## 2020-01-12 DIAGNOSIS — M810 Age-related osteoporosis without current pathological fracture: Secondary | ICD-10-CM | POA: Diagnosis not present

## 2020-01-26 DIAGNOSIS — L819 Disorder of pigmentation, unspecified: Secondary | ICD-10-CM | POA: Diagnosis not present

## 2020-01-26 DIAGNOSIS — L905 Scar conditions and fibrosis of skin: Secondary | ICD-10-CM | POA: Diagnosis not present

## 2020-01-26 DIAGNOSIS — L570 Actinic keratosis: Secondary | ICD-10-CM | POA: Diagnosis not present

## 2020-01-26 DIAGNOSIS — Z85828 Personal history of other malignant neoplasm of skin: Secondary | ICD-10-CM | POA: Diagnosis not present

## 2020-04-05 ENCOUNTER — Telehealth: Payer: Self-pay | Admitting: Acute Care

## 2020-04-05 ENCOUNTER — Other Ambulatory Visit: Payer: Self-pay | Admitting: Family Medicine

## 2020-04-05 DIAGNOSIS — I700 Atherosclerosis of aorta: Secondary | ICD-10-CM | POA: Diagnosis not present

## 2020-04-05 DIAGNOSIS — R7309 Other abnormal glucose: Secondary | ICD-10-CM | POA: Diagnosis not present

## 2020-04-05 DIAGNOSIS — F411 Generalized anxiety disorder: Secondary | ICD-10-CM | POA: Diagnosis not present

## 2020-04-05 DIAGNOSIS — Z1389 Encounter for screening for other disorder: Secondary | ICD-10-CM | POA: Diagnosis not present

## 2020-04-05 DIAGNOSIS — E039 Hypothyroidism, unspecified: Secondary | ICD-10-CM | POA: Diagnosis not present

## 2020-04-05 DIAGNOSIS — M810 Age-related osteoporosis without current pathological fracture: Secondary | ICD-10-CM

## 2020-04-05 DIAGNOSIS — F17210 Nicotine dependence, cigarettes, uncomplicated: Secondary | ICD-10-CM

## 2020-04-05 DIAGNOSIS — Z720 Tobacco use: Secondary | ICD-10-CM | POA: Diagnosis not present

## 2020-04-05 DIAGNOSIS — Z87891 Personal history of nicotine dependence: Secondary | ICD-10-CM

## 2020-04-05 DIAGNOSIS — Z0000 Encounter for general adult medical examination without abnormal findings: Secondary | ICD-10-CM | POA: Diagnosis not present

## 2020-04-05 DIAGNOSIS — F39 Unspecified mood [affective] disorder: Secondary | ICD-10-CM | POA: Diagnosis not present

## 2020-04-05 DIAGNOSIS — Z1231 Encounter for screening mammogram for malignant neoplasm of breast: Secondary | ICD-10-CM

## 2020-04-05 NOTE — Telephone Encounter (Signed)
I have spoke with [name] and her LCS CT has been scheduled for 04/11/20 @ 2:30pm and she is aware of the [address] appt and location

## 2020-04-05 NOTE — Telephone Encounter (Signed)
Will route to lung nodule pool.  Left detailed message for Diane with [facility].

## 2020-04-05 NOTE — Telephone Encounter (Signed)
Anita, can you contact pt to schedule f/u low dose CT?  New CT order has been placed.  

## 2020-04-11 ENCOUNTER — Other Ambulatory Visit: Payer: Self-pay

## 2020-04-11 ENCOUNTER — Ambulatory Visit (INDEPENDENT_AMBULATORY_CARE_PROVIDER_SITE_OTHER)
Admission: RE | Admit: 2020-04-11 | Discharge: 2020-04-11 | Disposition: A | Discharge status: Home / Self Care | Payer: Medicare PPO | Source: Ambulatory Visit | Attending: Family Medicine | Admitting: Family Medicine

## 2020-04-11 DIAGNOSIS — Z87891 Personal history of nicotine dependence: Secondary | ICD-10-CM | POA: Diagnosis not present

## 2020-04-11 DIAGNOSIS — F17210 Nicotine dependence, cigarettes, uncomplicated: Secondary | ICD-10-CM

## 2020-04-17 NOTE — Progress Notes (Signed)
Please call patient and let them  know their  low dose Ct was read as a Lung RADS 2: nodules that are benign in appearance and behavior with a very low likelihood of becoming a clinically active cancer due to size or lack of growth. Recommendation per radiology is for a repeat LDCT in 12 months. .Please let them  know we will order and schedule their  annual screening scan for 03/2021. Please let them  know there was notation of CAD on their  scan.  Please remind the patient  that this is a non-gated exam therefore degree or severity of disease  cannot be determined. Please have them  follow up with their PCP regarding potential risk factor modification, dietary therapy or pharmacologic therapy if clinically indicated. Pt.  Is not   currently on statin therapy. Please place order for annual  screening scan for  03/2021 and fax results to PCP. Thanks so much.

## 2020-04-18 ENCOUNTER — Other Ambulatory Visit: Payer: Self-pay | Admitting: *Deleted

## 2020-04-18 ENCOUNTER — Telehealth: Payer: Medicare PPO | Admitting: Emergency Medicine

## 2020-04-18 DIAGNOSIS — R0981 Nasal congestion: Secondary | ICD-10-CM

## 2020-04-18 DIAGNOSIS — Z87891 Personal history of nicotine dependence: Secondary | ICD-10-CM

## 2020-04-18 DIAGNOSIS — F17210 Nicotine dependence, cigarettes, uncomplicated: Secondary | ICD-10-CM

## 2020-04-18 MED ORDER — IPRATROPIUM BROMIDE 0.03 % NA SOLN: 2.0000 | Freq: Two times a day (BID) | NASAL | 0 refills | Status: DC

## 2020-04-18 NOTE — Progress Notes (Signed)
We are sorry that you are not feeling well.  Here is how we plan to help!  Based on what you have shared with me it looks like you have sinusitis.  Sinusitis is inflammation and infection in the sinus cavities of the head.  Based on your presentation I believe you most likely have Acute Viral Sinusitis.This is an infection most likely caused by a virus. There is not specific treatment for viral sinusitis other than to help you with the symptoms until the infection runs its course.  You may use an oral decongestant such as Mucinex D or if you have glaucoma or high blood pressure use plain Mucinex. Saline nasal spray help and can safely be used as often as needed for congestion, I also recommend overt-the-counter flonase.  I've also sent ATROVENT nasal spray which can help significantly with sinus congestion.  Some authorities believe that zinc sprays or the use of Echinacea may shorten the course of your symptoms.  Sinus infections are not as easily transmitted as other respiratory infection, however we still recommend that you avoid close contact with loved ones, especially the very young and elderly.  Remember to wash your hands thoroughly throughout the day as this is the number one way to prevent the spread of infection!  After careful review of your answers, I would not recommend an antibiotic for your condition.  Antibiotics are not effective against viruses and therefore should not be used to treat them. Common examples of infections caused by viruses include colds, flu, and covid.  Providers prescribe antibiotics to treat infections caused by bacteria. Antibiotics are very powerful in treating bacterial infections when they are used properly. To maintain their effectiveness, they should be used only when necessary. Overuse of antibiotics has resulted in the development of superbugs that are resistant to treatment!     Home Care:  Only take medications as instructed by your medical team.  Do  not take these medications with alcohol.  A steam or ultrasonic humidifier can help congestion.  You can place a towel over your head and breathe in the steam from hot water coming from a faucet.  Avoid close contacts especially the very young and the elderly.  Cover your mouth when you cough or sneeze.  Always remember to wash your hands.  Get Help Right Away If:  You develop worsening fever or sinus pain.  You develop a severe head ache or visual changes.  Your symptoms persist after you have completed your treatment plan.  Make sure you  Understand these instructions.  Will watch your condition.  Will get help right away if you are not doing well or get worse.  Your e-visit answers were reviewed by a board certified advanced clinical practitioner to complete your personal care plan.  Depending on the condition, your plan could have included both over the counter or prescription medications.  If there is a problem please reply  once you have received a response from your provider.  Your safety is important to [place].  If you have drug allergies check your prescription carefully.    You can use MyChart to ask questions about today's visit, request a non-urgent call back, or ask for a work or school excuse for 24 hours related to this e-Visit. If it has been greater than 24 hours you will need to follow up with your provider, or enter a new e-Visit to address those concerns.  You will get an e-mail in the next two days asking about  your experience.  I hope that your e-visit has been valuable and will speed your recovery. Thank you for using e-visits.  Approximately 5 minutes was used in reviewing the patient's chart, questionnaire, prescribing medications, and documentation.

## 2020-06-25 ENCOUNTER — Telehealth: Payer: Medicare PPO | Admitting: Physician Assistant

## 2020-06-25 DIAGNOSIS — B9689 Other specified bacterial agents as the cause of diseases classified elsewhere: Secondary | ICD-10-CM | POA: Diagnosis not present

## 2020-06-25 DIAGNOSIS — J019 Acute sinusitis, unspecified: Secondary | ICD-10-CM

## 2020-06-25 MED ORDER — DOXYCYCLINE HYCLATE 100 MG PO TABS: 100.0000 mg | ORAL_TABLET | Freq: Two times a day (BID) | ORAL | 0 refills | Status: DC

## 2020-06-25 NOTE — Progress Notes (Signed)

## 2020-06-25 NOTE — Progress Notes (Signed)
I have spent 5 minutes in review of e-visit questionnaire, review and updating patient chart, medical decision making and response to patient.   Heavenly Christine Cody Airyonna Franklyn, PA-C    

## 2020-07-26 DIAGNOSIS — L905 Scar conditions and fibrosis of skin: Secondary | ICD-10-CM | POA: Diagnosis not present

## 2020-07-26 DIAGNOSIS — Z85828 Personal history of other malignant neoplasm of skin: Secondary | ICD-10-CM | POA: Diagnosis not present

## 2020-07-26 DIAGNOSIS — L821 Other seborrheic keratosis: Secondary | ICD-10-CM | POA: Diagnosis not present

## 2020-07-26 DIAGNOSIS — L814 Other melanin hyperpigmentation: Secondary | ICD-10-CM | POA: Diagnosis not present

## 2020-07-26 DIAGNOSIS — L720 Epidermal cyst: Secondary | ICD-10-CM | POA: Diagnosis not present

## 2020-09-04 ENCOUNTER — Ambulatory Visit
Admission: RE | Admit: 2020-09-04 | Discharge: 2020-09-04 | Disposition: A | Discharge status: Home / Self Care | Payer: Medicare PPO | Source: Ambulatory Visit | Attending: Family Medicine | Admitting: Family Medicine

## 2020-09-04 ENCOUNTER — Other Ambulatory Visit: Payer: Self-pay

## 2020-09-04 DIAGNOSIS — Z1231 Encounter for screening mammogram for malignant neoplasm of breast: Secondary | ICD-10-CM | POA: Diagnosis not present

## 2020-09-04 DIAGNOSIS — M810 Age-related osteoporosis without current pathological fracture: Secondary | ICD-10-CM

## 2020-09-04 DIAGNOSIS — Z780 Asymptomatic menopausal state: Secondary | ICD-10-CM | POA: Diagnosis not present

## 2020-09-04 DIAGNOSIS — M85832 Other specified disorders of bone density and structure, left forearm: Secondary | ICD-10-CM | POA: Diagnosis not present

## 2020-09-05 DIAGNOSIS — M810 Age-related osteoporosis without current pathological fracture: Secondary | ICD-10-CM | POA: Diagnosis not present

## 2020-10-03 DIAGNOSIS — I700 Atherosclerosis of aorta: Secondary | ICD-10-CM | POA: Diagnosis not present

## 2020-10-03 DIAGNOSIS — F39 Unspecified mood [affective] disorder: Secondary | ICD-10-CM | POA: Diagnosis not present

## 2020-10-03 DIAGNOSIS — M810 Age-related osteoporosis without current pathological fracture: Secondary | ICD-10-CM | POA: Diagnosis not present

## 2020-10-03 DIAGNOSIS — D692 Other nonthrombocytopenic purpura: Secondary | ICD-10-CM | POA: Diagnosis not present

## 2020-10-03 DIAGNOSIS — Z720 Tobacco use: Secondary | ICD-10-CM | POA: Diagnosis not present

## 2020-10-03 DIAGNOSIS — E039 Hypothyroidism, unspecified: Secondary | ICD-10-CM | POA: Diagnosis not present

## 2020-10-03 DIAGNOSIS — F411 Generalized anxiety disorder: Secondary | ICD-10-CM | POA: Diagnosis not present

## 2020-10-03 DIAGNOSIS — R7309 Other abnormal glucose: Secondary | ICD-10-CM | POA: Diagnosis not present

## 2020-10-03 DIAGNOSIS — J449 Chronic obstructive pulmonary disease, unspecified: Secondary | ICD-10-CM | POA: Diagnosis not present

## 2020-11-04 DIAGNOSIS — Z720 Tobacco use: Secondary | ICD-10-CM | POA: Diagnosis not present

## 2020-11-04 DIAGNOSIS — D692 Other nonthrombocytopenic purpura: Secondary | ICD-10-CM | POA: Diagnosis not present

## 2020-11-04 DIAGNOSIS — E063 Autoimmune thyroiditis: Secondary | ICD-10-CM | POA: Diagnosis not present

## 2020-11-04 DIAGNOSIS — Z23 Encounter for immunization: Secondary | ICD-10-CM | POA: Diagnosis not present

## 2020-11-04 DIAGNOSIS — M810 Age-related osteoporosis without current pathological fracture: Secondary | ICD-10-CM | POA: Diagnosis not present

## 2020-11-04 DIAGNOSIS — E039 Hypothyroidism, unspecified: Secondary | ICD-10-CM | POA: Diagnosis not present

## 2020-12-19 DIAGNOSIS — E039 Hypothyroidism, unspecified: Secondary | ICD-10-CM | POA: Diagnosis not present

## 2020-12-30 DIAGNOSIS — H02834 Dermatochalasis of left upper eyelid: Secondary | ICD-10-CM | POA: Diagnosis not present

## 2020-12-30 DIAGNOSIS — Z961 Presence of intraocular lens: Secondary | ICD-10-CM | POA: Diagnosis not present

## 2020-12-30 DIAGNOSIS — H02831 Dermatochalasis of right upper eyelid: Secondary | ICD-10-CM | POA: Diagnosis not present

## 2021-01-14 DIAGNOSIS — C44712 Basal cell carcinoma of skin of right lower limb, including hip: Secondary | ICD-10-CM | POA: Diagnosis not present

## 2021-01-14 DIAGNOSIS — Z85828 Personal history of other malignant neoplasm of skin: Secondary | ICD-10-CM | POA: Diagnosis not present

## 2021-01-14 DIAGNOSIS — L814 Other melanin hyperpigmentation: Secondary | ICD-10-CM | POA: Diagnosis not present

## 2021-01-14 DIAGNOSIS — D225 Melanocytic nevi of trunk: Secondary | ICD-10-CM | POA: Diagnosis not present

## 2021-01-14 DIAGNOSIS — D485 Neoplasm of uncertain behavior of skin: Secondary | ICD-10-CM | POA: Diagnosis not present

## 2021-01-14 DIAGNOSIS — L821 Other seborrheic keratosis: Secondary | ICD-10-CM | POA: Diagnosis not present

## 2021-01-14 DIAGNOSIS — L728 Other follicular cysts of the skin and subcutaneous tissue: Secondary | ICD-10-CM | POA: Diagnosis not present

## 2021-01-14 DIAGNOSIS — L570 Actinic keratosis: Secondary | ICD-10-CM | POA: Diagnosis not present

## 2021-01-14 DIAGNOSIS — Z08 Encounter for follow-up examination after completed treatment for malignant neoplasm: Secondary | ICD-10-CM | POA: Diagnosis not present

## 2021-03-26 ENCOUNTER — Telehealth: Payer: Medicare PPO | Admitting: Physician Assistant

## 2021-03-26 DIAGNOSIS — J019 Acute sinusitis, unspecified: Secondary | ICD-10-CM | POA: Diagnosis not present

## 2021-03-26 DIAGNOSIS — B9689 Other specified bacterial agents as the cause of diseases classified elsewhere: Secondary | ICD-10-CM | POA: Diagnosis not present

## 2021-03-26 MED ORDER — DOXYCYCLINE HYCLATE 100 MG PO TABS: 100.0000 mg | ORAL_TABLET | Freq: Two times a day (BID) | ORAL | 0 refills | Status: DC

## 2021-03-26 NOTE — Progress Notes (Signed)

## 2021-03-26 NOTE — Progress Notes (Signed)
I have spent 5 minutes in review of e-visit questionnaire, review and updating patient chart, medical decision making and response to patient.   Jerald Villalona Cody Valleri Hendricksen, PA-C    

## 2021-04-07 DIAGNOSIS — F39 Unspecified mood [affective] disorder: Secondary | ICD-10-CM | POA: Diagnosis not present

## 2021-04-07 DIAGNOSIS — J449 Chronic obstructive pulmonary disease, unspecified: Secondary | ICD-10-CM | POA: Diagnosis not present

## 2021-04-07 DIAGNOSIS — I700 Atherosclerosis of aorta: Secondary | ICD-10-CM | POA: Diagnosis not present

## 2021-04-07 DIAGNOSIS — M810 Age-related osteoporosis without current pathological fracture: Secondary | ICD-10-CM | POA: Diagnosis not present

## 2021-04-07 DIAGNOSIS — Z1389 Encounter for screening for other disorder: Secondary | ICD-10-CM | POA: Diagnosis not present

## 2021-04-07 DIAGNOSIS — F411 Generalized anxiety disorder: Secondary | ICD-10-CM | POA: Diagnosis not present

## 2021-04-07 DIAGNOSIS — Z0000 Encounter for general adult medical examination without abnormal findings: Secondary | ICD-10-CM | POA: Diagnosis not present

## 2021-04-07 DIAGNOSIS — D692 Other nonthrombocytopenic purpura: Secondary | ICD-10-CM | POA: Diagnosis not present

## 2021-04-07 DIAGNOSIS — Z720 Tobacco use: Secondary | ICD-10-CM | POA: Diagnosis not present

## 2021-04-18 DIAGNOSIS — C44712 Basal cell carcinoma of skin of right lower limb, including hip: Secondary | ICD-10-CM | POA: Diagnosis not present

## 2021-05-01 ENCOUNTER — Encounter: Payer: Self-pay | Admitting: Internal Medicine

## 2021-05-01 ENCOUNTER — Ambulatory Visit: Payer: Medicare PPO | Admitting: Internal Medicine

## 2021-05-01 ENCOUNTER — Other Ambulatory Visit: Payer: Self-pay

## 2021-05-01 VITALS — BP 110/62 | HR 75 | Ht 64.5 in | Wt 123.8 lb

## 2021-05-01 DIAGNOSIS — R079 Chest pain, unspecified: Secondary | ICD-10-CM | POA: Diagnosis not present

## 2021-05-01 DIAGNOSIS — I700 Atherosclerosis of aorta: Secondary | ICD-10-CM

## 2021-05-01 DIAGNOSIS — I2510 Atherosclerotic heart disease of native coronary artery without angina pectoris: Secondary | ICD-10-CM | POA: Diagnosis not present

## 2021-05-01 DIAGNOSIS — Z720 Tobacco use: Secondary | ICD-10-CM

## 2021-05-01 DIAGNOSIS — E785 Hyperlipidemia, unspecified: Secondary | ICD-10-CM

## 2021-05-01 MED ORDER — ISOSORBIDE MONONITRATE ER 30 MG PO TB24: 30.0000 mg | ORAL_TABLET | Freq: Every day | ORAL | 3 refills | Status: DC

## 2021-05-01 MED ORDER — ASPIRIN EC 81 MG PO TBEC: 81.0000 mg | DELAYED_RELEASE_TABLET | Freq: Every day | ORAL | 3 refills | Status: AC

## 2021-05-01 MED ORDER — ATORVASTATIN CALCIUM 40 MG PO TABS: 40.0000 mg | ORAL_TABLET | Freq: Every day | ORAL | 3 refills | Status: AC

## 2021-05-01 NOTE — Progress Notes (Signed)
?Cardiology Office Note:   ? ?Date:  05/01/2021  ? ?ID:  [name], DOB Jun 15, 1950, MRN [id number] ? ?PCP:  Blair Heys, MD  ? ?[facility] ?Cardiologist:  Alverda Skeans, MD ?Referring MD: Blair Heys, MD  ? ?Chief Complaint/Reason for Referral:  Chest pain and Coronary calcium ? ?ASSESSMENT:   ? ?Chest pain, unspecified type - Plan: EKG 12-Lead, VAS [place] AAA DUPLEX, ECHOCARDIOGRAM COMPLETE ? ?Coronary artery calcification seen on CAT scan - Plan: EKG 12-Lead, VAS [place] AAA DUPLEX, ECHOCARDIOGRAM COMPLETE ? ?Aortic atherosclerosis (HCC) - Plan: EKG 12-Lead, VAS [place] AAA DUPLEX, ECHOCARDIOGRAM COMPLETE ? ?Hyperlipidemia, unspecified hyperlipidemia type - Plan: EKG 12-Lead, VAS [place] AAA DUPLEX, ECHOCARDIOGRAM COMPLETE ? ?Tobacco abuse - Plan: EKG 12-Lead, VAS [place] AAA DUPLEX, ECHOCARDIOGRAM COMPLETE ? ?PLAN:   ? ?In order of problems listed above: ?1.  We will start Imdur 30 mg at bedtime.  The patient does wheeze on occasion so we will refrain from beta-blockade at this time.  She has chest pain only sporadically.  If this becomes more of an issue we may need to proceed to cardiac catheterization. Follow-up in 3 months or earlier if needed. ?2.  We will start aspirin and statin with goal LDL of less than 70. ?3.  Aspirin and statin. ?4.  Goal LDL less than 70 we will check lipid panel and LFTs in 3 months. ?5.  We will check abdominal ultrasound for AAA screening. ? ? ?Dispo:  Return in about 3 months (around 08/01/2021).  ?  ? ?Medication Adjustments/Labs and Tests Ordered: ?Current medicines are reviewed at length with the patient today.  Concerns regarding medicines are outlined above.  ? ?Tests Ordered: ?Orders Placed This Encounter  ?Procedures  ? EKG 12-Lead  ? ECHOCARDIOGRAM COMPLETE  ? VAS [place] AAA DUPLEX  ? ? ?Medication Changes: ?Meds ordered this encounter  ?Medications  ? aspirin EC 81 MG tablet  ?  Sig: Take 1 tablet (81 mg total) by mouth daily. Swallow whole.  ?  Dispense:  90 tablet  ?   Refill:  3  ? atorvastatin (LIPITOR) 40 MG tablet  ?  Sig: Take 1 tablet (40 mg total) by mouth daily.  ?  Dispense:  90 tablet  ?  Refill:  3  ? isosorbide mononitrate (IMDUR) 30 MG 24 hr tablet  ?  Sig: Take 1 tablet (30 mg total) by mouth at bedtime.  ?  Dispense:  90 tablet  ?  Refill:  3  ? ? ?History of Present Illness:   ? ?FOCUSED PROBLEM LIST:   ?1.  Hypothyroidism ?2.  Ongoing tobacco abuse ?3.  Aortic atherosclerosis on CT scan ?4.  Coronary artery calcification on CT scan ?5.  COPD ? ? ?The patient is a 71 y.o. female with the indicated medical history here for recommendations regarding coronary artery calcification and occasional chest pain.  The patient was seen by her primary care provider recently and reported occasional bouts of chest pain.  She underwent a CT scan for lung cancer screening purposes which demonstrated aortic atherosclerosis and coronary artery calcification. ? ?She tells me that she occasionally gets chest pain when she walks up the stairs.  This happens maybe once a month.  She never had chest pain at rest.  She denies any bleeding or bruising.  She has required no hospitalizations or emergency room visits for chest pain or shortness of breath recently.  She does wheeze on occasion and unfortunately continues to smoke.  She has had no  significant peripheral edema orthopnea or signs or symptoms of stroke. ? ?    ?  ?Previous Medical History: ?Past Medical History:  ?Diagnosis Date  ? Anxiety   ? Aortic atherosclerosis (HCC)   ? CMC arthritis   ? COPD (chronic obstructive pulmonary disease) (HCC)   ? Coronary artery calcification seen on CAT scan   ? Hashimoto's thyroiditis   ? Hypothyroid   ? Hypothyroidism   ? Mood disorder (HCC)   ? Osteoarthritis   ? Osteoporosis   ? Sciatic leg pain   ? Tobacco abuse   ? ? ? ?Current Medications: ?Current Meds  ?Medication Sig  ? aspirin EC 81 MG tablet Take 1 tablet (81 mg total) by mouth daily. Swallow whole.  ? atorvastatin (LIPITOR) 40 MG  tablet Take 1 tablet (40 mg total) by mouth daily.  ? Cholecalciferol (VITAMIN D-3) 25 MCG (1000 UT) CAPS Take by mouth.  ? citalopram (CELEXA) 20 MG tablet Take 20 mg by mouth daily.  ? denosumab (PROLIA) 60 MG/ML SOSY injection Inject 60 mg into the skin every 6 (six) months.  ? ipratropium (ATROVENT) 0.03 % nasal spray Place 2 sprays into both nostrils every 12 (twelve) hours.  ? isosorbide mononitrate (IMDUR) 30 MG 24 hr tablet Take 1 tablet (30 mg total) by mouth at bedtime.  ? levothyroxine (SYNTHROID, LEVOTHROID) 112 MCG tablet Take 112 mcg by mouth daily.  ? loratadine (CLARITIN) 10 MG tablet Take 10 mg by mouth daily.  ? meclizine (ANTIVERT) 25 MG tablet Take 25 mg by mouth 3 (three) times daily as needed for dizziness.  ?  ? ?Allergies:    ?Penicillins, Chantix [varenicline], Neurontin [gabapentin], and Pseudoephedrine hcl  ? ?Social History:   ?Social History  ? ?Tobacco Use  ? Smoking status: Every Day  ?  Packs/day: 1.00  ?  Years: 47.00  ?  Pack years: 47.00  ?  Types: Cigarettes  ? Smokeless tobacco: Never  ?Substance Use Topics  ? Alcohol use: No  ? Drug use: No  ?  ? ?Family Hx: ?History reviewed. No pertinent family history.  ? ?Review of Systems:   ?Please see the history of present illness.    ?All other systems reviewed and are negative. ?  ? ? ?EKGs/Labs/Other Test Reviewed:   ? ?EKG:  External EKG NSR; EKG today shows sinus rhythmj ? ?Prior CV studies: ?None available ? ?Imaging studies that I have independently reviewed today:  ? ?CT 2/23 ?1. Lung-RADS 2S, benign appearance or behavior. Continue annual ?screening with low-dose chest CT without contrast in 12 months. ?2. The "S" modifier above refers to potentially clinically ?significant non lung cancer related findings. Specifically, there is ?aortic atherosclerosis, in addition to left main and left anterior ?descending coronary artery disease. Please note that although the ?presence of coronary artery calcium documents the presence  of ?coronary artery disease, the severity of this disease and any ?potential stenosis cannot be assessed on this non-gated CT ?examination. Assessment for potential risk factor modification, ?dietary therapy or pharmacologic therapy may be warranted, if ?clinically indicated. ?3. Mild diffuse bronchial wall thickening with mild centrilobular ?and paraseptal emphysema; imaging findings suggestive of underlying ?COPD. ? ?Recent Labs: ?No results found for requested labs within last 8760 hours.  ? ?Recent Lipid Panel ?No results found for: CHOL, TRIG, HDL, LDLCALC, LDLDIRECT ? ?Risk Assessment/Calculations:   ? ? ?    ? ?Physical Exam:   ? ?VS:  BP 110/62   Pulse 75   Ht 5' 4.5" (  1.638 m)   Wt 123 lb 12.8 oz (56.2 kg)   SpO2 97%   BMI 20.92 kg/m?    ?Wt Readings from Last 3 Encounters:  ?05/01/21 123 lb 12.8 oz (56.2 kg)  ?  ?GENERAL:  No apparent distress, AOx3 ?HEENT:  No carotid bruits, +2 carotid impulses, no scleral icterus ?CAR: RRR no murmurs, gallops, rubs, or thrills ?RES:  Clear to auscultation bilaterally ?ABD:  Soft, nontender, nondistended, positive bowel sounds x 4 ?VASC:  +2 radial pulses, +2 carotid pulses, palpable pedal pulses ?NEURO:  CN 2-12 grossly intact; motor and sensory grossly intact ?PSYCH:  No active depression or anxiety ?EXT:  No edema, ecchymosis, or cyanosis ? ?Signed, ?Orbie Pyo, MD  ?05/01/2021 9:22 AM    ?[facility] ?[address] [place], [place], [place]  [address] ?Phone: [phone]; Fax: [phone]  ? ?Note:  This document was prepared using Dragon voice recognition software and may include unintentional dictation errors. ?

## 2021-05-01 NOTE — Patient Instructions (Signed)
Medication Instructions:  ?Your physician has recommended you make the following change in your medication:  ?1.) start IMDUR 30 - one tablet daily at bedtime ?2.) start aspirin 81 mg one tablet daily ?3.) start atorvastatin 40 mg - take one tablet daily ? ?*If you need a refill on your cardiac medications before your next appointment, please call your pharmacy* ? ? ?Lab Work: ?Please return in June prior to next appointment for blood work only Therapist, occupational) ? ? ? ?Testing/Procedures: ?Your physician has requested that you have an echocardiogram. Echocardiography is a painless test that uses sound waves to create images of your heart. It provides your doctor with information about the size and shape of your heart and how well your heart?s chambers and valves are working. This procedure takes approximately one hour. There are no restrictions for this procedure. ? ?Your physician has requested that you have an abdominal aorta duplex. During this test, an ultrasound is used to evaluate the aorta. Allow 30 minutes for this exam. Do not eat after midnight the day before and avoid carbonated beverages ? ? ? ?Follow-Up: ?At [facility], you and your health needs are our priority.  As part of our continuing mission to provide you with exceptional heart care, we have created designated Provider Care Teams.  These Care Teams include your primary Cardiologist (physician) and Advanced Practice Providers (APPs -  Physician Assistants and Nurse Practitioners) who all work together to provide you with the care you need, when you need it. ? ? ?Your next appointment:   ?3 month(s) ? ?The format for your next appointment:   ?In Person ? ?Provider:   ?Alverda Skeans, MD ? ? ? ?  ?

## 2021-05-12 ENCOUNTER — Other Ambulatory Visit: Payer: Self-pay

## 2021-05-12 ENCOUNTER — Ambulatory Visit (HOSPITAL_COMMUNITY): Payer: Medicare PPO | Attending: Cardiology

## 2021-05-12 DIAGNOSIS — I700 Atherosclerosis of aorta: Secondary | ICD-10-CM | POA: Diagnosis not present

## 2021-05-12 DIAGNOSIS — E785 Hyperlipidemia, unspecified: Secondary | ICD-10-CM | POA: Insufficient documentation

## 2021-05-12 DIAGNOSIS — R079 Chest pain, unspecified: Secondary | ICD-10-CM | POA: Diagnosis not present

## 2021-05-12 DIAGNOSIS — Z720 Tobacco use: Secondary | ICD-10-CM | POA: Diagnosis not present

## 2021-05-12 DIAGNOSIS — I2510 Atherosclerotic heart disease of native coronary artery without angina pectoris: Secondary | ICD-10-CM | POA: Insufficient documentation

## 2021-05-12 LAB — ECHOCARDIOGRAM COMPLETE
Area-P 1/2: 2.85 cm2
P 1/2 time: 728 msec
S' Lateral: 3 cm

## 2021-05-13 ENCOUNTER — Other Ambulatory Visit: Payer: Self-pay | Admitting: *Deleted

## 2021-05-13 DIAGNOSIS — Z87891 Personal history of nicotine dependence: Secondary | ICD-10-CM

## 2021-05-13 DIAGNOSIS — F17210 Nicotine dependence, cigarettes, uncomplicated: Secondary | ICD-10-CM

## 2021-05-16 ENCOUNTER — Other Ambulatory Visit: Payer: Self-pay | Admitting: Internal Medicine

## 2021-05-16 DIAGNOSIS — E785 Hyperlipidemia, unspecified: Secondary | ICD-10-CM

## 2021-05-16 DIAGNOSIS — I700 Atherosclerosis of aorta: Secondary | ICD-10-CM

## 2021-05-16 DIAGNOSIS — Z720 Tobacco use: Secondary | ICD-10-CM

## 2021-05-20 ENCOUNTER — Ambulatory Visit (INDEPENDENT_AMBULATORY_CARE_PROVIDER_SITE_OTHER)
Admission: RE | Admit: 2021-05-20 | Discharge: 2021-05-20 | Disposition: A | Discharge status: Home / Self Care | Payer: Medicare PPO | Source: Ambulatory Visit | Attending: Acute Care | Admitting: Acute Care

## 2021-05-20 ENCOUNTER — Other Ambulatory Visit: Payer: Self-pay

## 2021-05-20 DIAGNOSIS — Z87891 Personal history of nicotine dependence: Secondary | ICD-10-CM | POA: Diagnosis not present

## 2021-05-20 DIAGNOSIS — F17210 Nicotine dependence, cigarettes, uncomplicated: Secondary | ICD-10-CM | POA: Diagnosis not present

## 2021-05-22 ENCOUNTER — Other Ambulatory Visit: Payer: Self-pay

## 2021-05-22 ENCOUNTER — Ambulatory Visit (HOSPITAL_COMMUNITY)
Admission: RE | Admit: 2021-05-22 | Discharge: 2021-05-22 | Disposition: A | Discharge status: Home / Self Care | Payer: Medicare PPO | Source: Ambulatory Visit | Attending: Cardiology | Admitting: Cardiology

## 2021-05-22 DIAGNOSIS — Z87891 Personal history of nicotine dependence: Secondary | ICD-10-CM

## 2021-05-22 DIAGNOSIS — Z720 Tobacco use: Secondary | ICD-10-CM | POA: Diagnosis not present

## 2021-05-22 DIAGNOSIS — E785 Hyperlipidemia, unspecified: Secondary | ICD-10-CM | POA: Insufficient documentation

## 2021-05-22 DIAGNOSIS — I700 Atherosclerosis of aorta: Secondary | ICD-10-CM | POA: Diagnosis not present

## 2021-05-22 DIAGNOSIS — F17210 Nicotine dependence, cigarettes, uncomplicated: Secondary | ICD-10-CM

## 2021-05-23 ENCOUNTER — Telehealth: Payer: Self-pay

## 2021-05-23 DIAGNOSIS — I700 Atherosclerosis of aorta: Secondary | ICD-10-CM

## 2021-05-23 NOTE — Telephone Encounter (Signed)
-----   Message from Orbie Pyo, MD sent at 05/23/2021  6:26 AM EDT ----- ?Let her know this is a mild dilatation of the abdominal aorta we have to watch.  Repeat abd u/d in 12 months.  Controlling BP and quitting smoking will help it not enlarge.  Her BP was good at last visit.  Can't start BB due to wheezing. ?

## 2021-06-03 ENCOUNTER — Encounter: Payer: Self-pay | Admitting: Internal Medicine

## 2021-06-03 NOTE — Telephone Encounter (Signed)
I called patient to discuss.  She is referring to the ascending aorta measurement on the annual lung cancer screening done on 05/15/20. ? ?She says this aneurysm was not present on her last scan.  Pt requesting Dr. Ali Lowe review this finding.  It has not been discussed w her by the ordering provider, she saw it on MyChart.  I adv in general the recommendations for an enlarged aorta would include managing blood pressure and to smoking cessation.  She is working on this.  I also adv there is a dedicated scan to look at the ascending aorta. ? ?She is aware I will forward the information to Dr. Ali Lowe and we will call her with any recommendations. ?

## 2021-07-29 ENCOUNTER — Other Ambulatory Visit: Payer: Self-pay | Admitting: Family Medicine

## 2021-07-29 DIAGNOSIS — Z1231 Encounter for screening mammogram for malignant neoplasm of breast: Secondary | ICD-10-CM

## 2021-08-11 DIAGNOSIS — L814 Other melanin hyperpigmentation: Secondary | ICD-10-CM | POA: Diagnosis not present

## 2021-08-11 DIAGNOSIS — L821 Other seborrheic keratosis: Secondary | ICD-10-CM | POA: Diagnosis not present

## 2021-08-11 DIAGNOSIS — Z86007 Personal history of in-situ neoplasm of skin: Secondary | ICD-10-CM | POA: Diagnosis not present

## 2021-08-11 DIAGNOSIS — Z08 Encounter for follow-up examination after completed treatment for malignant neoplasm: Secondary | ICD-10-CM | POA: Diagnosis not present

## 2021-08-12 ENCOUNTER — Other Ambulatory Visit: Payer: Medicare PPO

## 2021-08-12 DIAGNOSIS — E785 Hyperlipidemia, unspecified: Secondary | ICD-10-CM | POA: Diagnosis not present

## 2021-08-12 DIAGNOSIS — Z720 Tobacco use: Secondary | ICD-10-CM | POA: Diagnosis not present

## 2021-08-12 DIAGNOSIS — I700 Atherosclerosis of aorta: Secondary | ICD-10-CM | POA: Diagnosis not present

## 2021-08-12 DIAGNOSIS — R079 Chest pain, unspecified: Secondary | ICD-10-CM

## 2021-08-12 DIAGNOSIS — I2510 Atherosclerotic heart disease of native coronary artery without angina pectoris: Secondary | ICD-10-CM

## 2021-08-13 LAB — HEPATIC FUNCTION PANEL
ALT: 8 IU/L (ref 0–32)
AST: 15 IU/L (ref 0–40)
Albumin: 4.5 g/dL (ref 3.8–4.8)
Alkaline Phosphatase: 46 IU/L (ref 44–121)
Bilirubin Total: 0.4 mg/dL (ref 0.0–1.2)
Bilirubin, Direct: 0.14 mg/dL (ref 0.00–0.40)
Total Protein: 6.3 g/dL (ref 6.0–8.5)

## 2021-08-13 LAB — LIPID PANEL
Chol/HDL Ratio: 2.2 ratio (ref 0.0–4.4)
Cholesterol, Total: 125 mg/dL (ref 100–199)
HDL: 57 mg/dL (ref >39)
LDL Chol Calc (NIH): 53 mg/dL (ref 0–99)
Triglycerides: 77 mg/dL (ref 0–149)
VLDL Cholesterol Cal: 15 mg/dL (ref 5–40)

## 2021-08-18 NOTE — Progress Notes (Signed)
Cardiology Office Note:    Date:  08/21/2021   ID:  [name], DOB January 14, 1951, MRN [id number]  PCP:  Blair Heys, MD   [facility] Providers Cardiologist:  Alverda Skeans, MD Referring MD: Blair Heys, MD   Chief Complaint/Reason for Referral:  Chest pain and Coronary calcium  ASSESSMENT:    Chest pain, unspecified type - Plan: LONG TERM MONITOR (3-14 DAYS), CT ANGIO CHEST AORTA W/CM & OR WO/CM, Basic Metabolic Panel (BMET), CANCELED: CT ANGIO CHEST AORTA W/CM & OR WO/CM  Coronary artery calcification seen on CAT scan - Plan: LONG TERM MONITOR (3-14 DAYS), CT ANGIO CHEST AORTA W/CM & OR WO/CM, Basic Metabolic Panel (BMET), CANCELED: CT ANGIO CHEST AORTA W/CM & OR WO/CM  Hyperlipidemia, unspecified hyperlipidemia type - Plan: LONG TERM MONITOR (3-14 DAYS), CT ANGIO CHEST AORTA W/CM & OR WO/CM, Basic Metabolic Panel (BMET), CANCELED: CT ANGIO CHEST AORTA W/CM & OR WO/CM  Aortic atherosclerosis (HCC) - Plan: LONG TERM MONITOR (3-14 DAYS), CT ANGIO CHEST AORTA W/CM & OR WO/CM, Basic Metabolic Panel (BMET), CANCELED: CT ANGIO CHEST AORTA W/CM & OR WO/CM  Aneurysm of ascending aorta without rupture (HCC) - Plan: LONG TERM MONITOR (3-14 DAYS), CT ANGIO CHEST AORTA W/CM & OR WO/CM, Basic Metabolic Panel (BMET), CANCELED: CT ANGIO CHEST AORTA W/CM & OR WO/CM  Palpitations - Plan: LONG TERM MONITOR (3-14 DAYS), CT ANGIO CHEST AORTA W/CM & OR WO/CM, Basic Metabolic Panel (BMET), CANCELED: CT ANGIO CHEST AORTA W/CM & OR WO/CM  PLAN:    In order of problems listed above: 1.  Chest pain: She is well controlled on Imdur.  She denies any exertional angina. 2.  Coronary artery calcification: Continue aspirin, statin, and Imdur. 3.  Hyperlipidemia: Lipid panel recently was at goal of 53.  Continue current therapy. 4.  Aortic atherosclerosis: Continue aspirin, statin, and strict blood pressure control. 5.  Ascending aortic aneurysm.  Incidentally noted; will obtain CT scan in March  2024. 6.  Palpitations: We will obtain monitor to evaluate further.  Dispo:  Return in about 9 months (around 05/22/2022).     Medication Adjustments/Labs and Tests Ordered: Current medicines are reviewed at length with the patient today.  Concerns regarding medicines are outlined above.   Tests Ordered: Orders Placed This Encounter  Procedures   CT ANGIO CHEST AORTA W/CM & OR WO/CM   Basic Metabolic Panel (BMET)   LONG TERM MONITOR (3-14 DAYS)    Medication Changes: No orders of the defined types were placed in this encounter.   History of Present Illness:    FOCUSED PROBLEM LIST:   1.  Hypothyroidism 2.  Ongoing tobacco abuse 3.  Aortic atherosclerosis on CT scan 4.  Coronary artery calcification on CT scan 5.  COPD 6.  Ascending aortic aneurysm on chest CT March 2023   The patient is a 71 y.o. female with the indicated medical history here for follow-up.    March 2023.  The patient was seen for initial consultation regarding coronary artery calcification and occasional chest pain she.  The pain occurred very infrequently.  She was started on Imdur 30 mg.  Given her history of wheezing beta-blocker was deferred.  She is referred for abdominal ultrasound given her history of smoking.  Today: The patient is doing well.  She denies any exertional angina.  She denies any significant dyspnea.  She does note in the evening she will get palpitations at times associated with fatigue.  Palpitations do not seem to be associate with  chest pain or shortness of breath.  She denies any presyncope or syncope.  She has not required emergency room visits or hospitalizations for breathing or chest pain issues.  She denies any claudication.        Previous Medical History: Past Medical History:  Diagnosis Date   Anxiety    Aortic atherosclerosis (HCC)    CMC arthritis    COPD (chronic obstructive pulmonary disease) (HCC)    Coronary artery calcification seen on CAT scan    Hashimoto's  thyroiditis    Hypothyroid    Hypothyroidism    Mood disorder (HCC)    Osteoarthritis    Osteoporosis    Sciatic leg pain    Tobacco abuse      Current Medications: Current Meds  Medication Sig   aspirin EC 81 MG tablet Take 1 tablet (81 mg total) by mouth daily. Swallow whole.   atorvastatin (LIPITOR) 40 MG tablet Take 1 tablet (40 mg total) by mouth daily.   Cholecalciferol (VITAMIN D-3) 25 MCG (1000 UT) CAPS Take by mouth.   citalopram (CELEXA) 20 MG tablet Take 20 mg by mouth daily.   denosumab (PROLIA) 60 MG/ML SOSY injection Inject 60 mg into the skin every 6 (six) months.   isosorbide mononitrate (IMDUR) 30 MG 24 hr tablet Take 1 tablet (30 mg total) by mouth at bedtime.   loratadine (CLARITIN) 10 MG tablet Take 10 mg by mouth daily.   meclizine (ANTIVERT) 25 MG tablet Take 25 mg by mouth 3 (three) times daily as needed for dizziness.   SYNTHROID 100 MCG tablet Take 100 mcg by mouth every morning.     Allergies:    Penicillins, Chantix [varenicline], Neurontin [gabapentin], and Pseudoephedrine hcl   Social History:   Social History   Tobacco Use   Smoking status: Every Day    Packs/day: 1.00    Years: 47.00    Total pack years: 47.00    Types: Cigarettes   Smokeless tobacco: Never  Substance Use Topics   Alcohol use: No   Drug use: No     Family Hx: History reviewed. No pertinent family history.   Review of Systems:   Please see the history of present illness.    All other systems reviewed and are negative.     EKGs/Labs/Other Test Reviewed:    EKG:  External EKG NSR; EKG today shows sinus rhythmj  Prior CV studies: None available  Imaging studies that I have independently reviewed today:   Chest CT 2023 with ascending aortic aneurysm of 4 cm and aortic atherosclerosis as well as coronary artery calcification  Recent Labs: 08/12/2021: ALT 8   Recent Lipid Panel Lab Results  Component Value Date/Time   CHOL 125 08/12/2021 07:53 AM   TRIG 77  08/12/2021 07:53 AM   HDL 57 08/12/2021 07:53 AM   LDLCALC 53 08/12/2021 07:53 AM    Risk Assessment/Calculations:          Physical Exam:    VS:  BP 120/60   Pulse 82   Ht 5' 4.5" (1.638 m)   Wt 122 lb 9.6 oz (55.6 kg)   SpO2 96%   BMI 20.72 kg/m    Wt Readings from Last 3 Encounters:  08/21/21 122 lb 9.6 oz (55.6 kg)  05/01/21 123 lb 12.8 oz (56.2 kg)    GENERAL:  No apparent distress, AOx3 HEENT:  No carotid bruits, +2 carotid impulses, no scleral icterus CAR: RRR no murmurs, gallops, rubs, or thrills RES:  Clear to  auscultation bilaterally ABD:  Soft, nontender, nondistended, positive bowel sounds x 4 VASC:  +2 radial pulses, +2 carotid pulses, palpable pedal pulses NEURO:  CN 2-12 grossly intact; motor and sensory grossly intact PSYCH:  No active depression or anxiety EXT:  No edema, ecchymosis, or cyanosis  Signed, Orbie Pyo, MD  08/21/2021 10:37 AM    [facility] [address], [place], [place]  [address] Phone: [phone]; Fax: [phone]   Note:  This document was prepared using Dragon voice recognition software and may include unintentional dictation errors.

## 2021-08-21 ENCOUNTER — Encounter: Payer: Self-pay | Admitting: Internal Medicine

## 2021-08-21 ENCOUNTER — Ambulatory Visit (INDEPENDENT_AMBULATORY_CARE_PROVIDER_SITE_OTHER): Payer: Medicare PPO

## 2021-08-21 ENCOUNTER — Ambulatory Visit: Payer: Medicare PPO | Admitting: Internal Medicine

## 2021-08-21 VITALS — BP 120/60 | HR 82 | Ht 64.5 in | Wt 122.6 lb

## 2021-08-21 DIAGNOSIS — R079 Chest pain, unspecified: Secondary | ICD-10-CM

## 2021-08-21 DIAGNOSIS — I700 Atherosclerosis of aorta: Secondary | ICD-10-CM | POA: Diagnosis not present

## 2021-08-21 DIAGNOSIS — I7121 Aneurysm of the ascending aorta, without rupture: Secondary | ICD-10-CM

## 2021-08-21 DIAGNOSIS — I2510 Atherosclerotic heart disease of native coronary artery without angina pectoris: Secondary | ICD-10-CM

## 2021-08-21 DIAGNOSIS — E785 Hyperlipidemia, unspecified: Secondary | ICD-10-CM | POA: Diagnosis not present

## 2021-08-21 DIAGNOSIS — R002 Palpitations: Secondary | ICD-10-CM

## 2021-08-21 NOTE — Progress Notes (Unsigned)
ZIO XT serial # [id number] from office inventory applied to patient.  Dr. Lynnette Caffey to read.

## 2021-08-21 NOTE — Patient Instructions (Signed)
Medication Instructions:  Your physician recommends that you continue on your current medications as directed. Please refer to the Current Medication list given to you today.  *If you need a refill on your cardiac medications before your next appointment, please call your pharmacy*   Testing/Procedures: CT Chest aorta March 2024    Follow-Up: At [facility], you and your health needs are our priority.  As part of our continuing mission to provide you with exceptional heart care, we have created designated Provider Care Teams.  These Care Teams include your primary Cardiologist (physician) and Advanced Practice Providers (APPs -  Physician Assistants and Nurse Practitioners) who all work together to provide you with the care you need, when you need it.  Your next appointment:   9 month(s)  The format for your next appointment:   In Person  Provider:   Orbie Pyo, MD     Other Instructions Christena Deem- Long Term Monitor Instructions  Your physician has requested you wear a ZIO patch monitor for 3 days.  This is a single patch monitor. [employer] supplies one patch monitor per enrollment. Additional stickers are not available. Please do not apply patch if you will be having a Nuclear Stress Test,  Echocardiogram, Cardiac CT, MRI, or Chest Xray during the period you would be wearing the  monitor. The patch cannot be worn during these tests. You cannot remove and re-apply the  ZIO XT patch monitor.  Your ZIO patch monitor will be mailed 3 day USPS to your address on file. It may take 3-5 days  to receive your monitor after you have been enrolled.  Once you have received your monitor, please review the enclosed instructions. Your monitor  has already been registered assigning a specific monitor serial # to you.  Billing and Patient Assistance Program Information  We have supplied [employer] with any of your insurance information on file for billing purposes. [employer] offers a sliding  scale Patient Assistance Program for patients that do not have  insurance, or whose insurance does not completely cover the cost of the ZIO monitor.  You must apply for the Patient Assistance Program to qualify for this discounted rate.  To apply, please call [employer] at [phone], select option 4, select option 2, ask to apply for  Patient Assistance Program. Meredeth Ide will ask your household income, and how many people  are in your household. They will quote your out-of-pocket cost based on that information.  [employer] will also be able to set up a 78-month, interest-free payment plan if needed.  Applying the monitor   Shave hair from upper left chest.  Hold abrader disc by orange tab. Rub abrader in 40 strokes over the upper left chest as  indicated in your monitor instructions.  Clean area with 4 enclosed alcohol pads. Let dry.  Apply patch as indicated in monitor instructions. Patch will be placed under collarbone on left  side of chest with arrow pointing upward.  Rub patch adhesive wings for 2 minutes. Remove white label marked "1". Remove the white  label marked "2". Rub patch adhesive wings for 2 additional minutes.  While looking in a mirror, press and release button in center of patch. A small green light will  flash 3-4 times. This will be your only indicator that the monitor has been turned on.  Do not shower for the first 24 hours. You may shower after the first 24 hours.  Press the button if you feel a symptom. You will hear  a small click. Record Date, Time and  Symptom in the Patient Logbook.  When you are ready to remove the patch, follow instructions on the last 2 pages of Patient  Logbook. Stick patch monitor onto the last page of Patient Logbook.  Place Patient Logbook in the blue and white box. Use locking tab on box and tape box closed  securely. The blue and white box has prepaid postage on it. Please place it in the mailbox as  soon as possible. Your physician should  have your test results approximately 7 days after the  monitor has been mailed back to [facility].  Call [facility] Customer Care at [phone] if you have questions regarding  your ZIO XT patch monitor. Call them immediately if you see an orange light blinking on your  monitor.  If your monitor falls off in less than 4 days, contact our Monitor department at [phone].  If your monitor becomes loose or falls off after 4 days call [employer] at [phone] for  suggestions on securing your monitor   Important Information About Sugar

## 2021-09-02 DIAGNOSIS — E785 Hyperlipidemia, unspecified: Secondary | ICD-10-CM | POA: Diagnosis not present

## 2021-09-02 DIAGNOSIS — R079 Chest pain, unspecified: Secondary | ICD-10-CM | POA: Diagnosis not present

## 2021-09-02 DIAGNOSIS — I700 Atherosclerosis of aorta: Secondary | ICD-10-CM | POA: Diagnosis not present

## 2021-09-15 DIAGNOSIS — Z1231 Encounter for screening mammogram for malignant neoplasm of breast: Secondary | ICD-10-CM

## 2021-09-16 ENCOUNTER — Ambulatory Visit
Admission: RE | Admit: 2021-09-16 | Discharge: 2021-09-16 | Disposition: A | Discharge status: Home / Self Care | Payer: Medicare PPO | Source: Ambulatory Visit | Attending: Family Medicine | Admitting: Family Medicine

## 2021-09-16 DIAGNOSIS — Z1231 Encounter for screening mammogram for malignant neoplasm of breast: Secondary | ICD-10-CM | POA: Diagnosis not present

## 2021-10-06 DIAGNOSIS — Z79899 Other long term (current) drug therapy: Secondary | ICD-10-CM | POA: Diagnosis not present

## 2021-10-06 DIAGNOSIS — J449 Chronic obstructive pulmonary disease, unspecified: Secondary | ICD-10-CM | POA: Diagnosis not present

## 2021-10-06 DIAGNOSIS — M19049 Primary osteoarthritis, unspecified hand: Secondary | ICD-10-CM | POA: Diagnosis not present

## 2021-10-06 DIAGNOSIS — F411 Generalized anxiety disorder: Secondary | ICD-10-CM | POA: Diagnosis not present

## 2021-10-06 DIAGNOSIS — F39 Unspecified mood [affective] disorder: Secondary | ICD-10-CM | POA: Diagnosis not present

## 2021-10-06 DIAGNOSIS — M810 Age-related osteoporosis without current pathological fracture: Secondary | ICD-10-CM | POA: Diagnosis not present

## 2021-10-06 DIAGNOSIS — E039 Hypothyroidism, unspecified: Secondary | ICD-10-CM | POA: Diagnosis not present

## 2021-10-06 DIAGNOSIS — I700 Atherosclerosis of aorta: Secondary | ICD-10-CM | POA: Diagnosis not present

## 2021-10-06 DIAGNOSIS — I7121 Aneurysm of the ascending aorta, without rupture: Secondary | ICD-10-CM | POA: Diagnosis not present

## 2021-11-04 DIAGNOSIS — M810 Age-related osteoporosis without current pathological fracture: Secondary | ICD-10-CM | POA: Diagnosis not present

## 2021-11-04 DIAGNOSIS — E039 Hypothyroidism, unspecified: Secondary | ICD-10-CM | POA: Diagnosis not present

## 2021-11-04 DIAGNOSIS — Z23 Encounter for immunization: Secondary | ICD-10-CM | POA: Diagnosis not present

## 2021-11-04 DIAGNOSIS — E063 Autoimmune thyroiditis: Secondary | ICD-10-CM | POA: Diagnosis not present

## 2021-11-04 DIAGNOSIS — Z720 Tobacco use: Secondary | ICD-10-CM | POA: Diagnosis not present

## 2021-12-31 DIAGNOSIS — H02831 Dermatochalasis of right upper eyelid: Secondary | ICD-10-CM | POA: Diagnosis not present

## 2021-12-31 DIAGNOSIS — H16223 Keratoconjunctivitis sicca, not specified as Sjogren's, bilateral: Secondary | ICD-10-CM | POA: Diagnosis not present

## 2021-12-31 DIAGNOSIS — Z961 Presence of intraocular lens: Secondary | ICD-10-CM | POA: Diagnosis not present

## 2021-12-31 DIAGNOSIS — H02834 Dermatochalasis of left upper eyelid: Secondary | ICD-10-CM | POA: Diagnosis not present

## 2022-02-19 DIAGNOSIS — L821 Other seborrheic keratosis: Secondary | ICD-10-CM | POA: Diagnosis not present

## 2022-02-19 DIAGNOSIS — L814 Other melanin hyperpigmentation: Secondary | ICD-10-CM | POA: Diagnosis not present

## 2022-02-19 DIAGNOSIS — L570 Actinic keratosis: Secondary | ICD-10-CM | POA: Diagnosis not present

## 2022-02-19 DIAGNOSIS — D225 Melanocytic nevi of trunk: Secondary | ICD-10-CM | POA: Diagnosis not present

## 2022-03-14 ENCOUNTER — Telehealth: Payer: Medicare PPO | Admitting: Nurse Practitioner

## 2022-03-14 DIAGNOSIS — J019 Acute sinusitis, unspecified: Secondary | ICD-10-CM

## 2022-03-14 DIAGNOSIS — B9689 Other specified bacterial agents as the cause of diseases classified elsewhere: Secondary | ICD-10-CM | POA: Diagnosis not present

## 2022-03-14 MED ORDER — DOXYCYCLINE HYCLATE 100 MG PO TABS: 100.0000 mg | ORAL_TABLET | Freq: Two times a day (BID) | ORAL | 0 refills | Status: DC

## 2022-03-14 NOTE — Progress Notes (Signed)

## 2022-04-09 DIAGNOSIS — J449 Chronic obstructive pulmonary disease, unspecified: Secondary | ICD-10-CM | POA: Diagnosis not present

## 2022-04-09 DIAGNOSIS — F39 Unspecified mood [affective] disorder: Secondary | ICD-10-CM | POA: Diagnosis not present

## 2022-04-09 DIAGNOSIS — R110 Nausea: Secondary | ICD-10-CM | POA: Diagnosis not present

## 2022-04-09 DIAGNOSIS — Z79899 Other long term (current) drug therapy: Secondary | ICD-10-CM | POA: Diagnosis not present

## 2022-04-09 DIAGNOSIS — I700 Atherosclerosis of aorta: Secondary | ICD-10-CM | POA: Diagnosis not present

## 2022-04-09 DIAGNOSIS — I2510 Atherosclerotic heart disease of native coronary artery without angina pectoris: Secondary | ICD-10-CM | POA: Diagnosis not present

## 2022-04-09 DIAGNOSIS — E039 Hypothyroidism, unspecified: Secondary | ICD-10-CM | POA: Diagnosis not present

## 2022-04-09 DIAGNOSIS — F411 Generalized anxiety disorder: Secondary | ICD-10-CM | POA: Diagnosis not present

## 2022-04-09 DIAGNOSIS — Z0000 Encounter for general adult medical examination without abnormal findings: Secondary | ICD-10-CM | POA: Diagnosis not present

## 2022-04-10 ENCOUNTER — Other Ambulatory Visit: Payer: Self-pay | Admitting: Internal Medicine

## 2022-04-15 DIAGNOSIS — Z1211 Encounter for screening for malignant neoplasm of colon: Secondary | ICD-10-CM | POA: Diagnosis not present

## 2022-04-29 ENCOUNTER — Ambulatory Visit
Admission: RE | Admit: 2022-04-29 | Discharge: 2022-04-29 | Disposition: A | Discharge status: Home / Self Care | Payer: Medicare PPO | Source: Ambulatory Visit | Attending: Internal Medicine | Admitting: Internal Medicine

## 2022-04-29 DIAGNOSIS — R079 Chest pain, unspecified: Secondary | ICD-10-CM

## 2022-04-29 DIAGNOSIS — E890 Postprocedural hypothyroidism: Secondary | ICD-10-CM | POA: Diagnosis not present

## 2022-04-29 DIAGNOSIS — I359 Nonrheumatic aortic valve disorder, unspecified: Secondary | ICD-10-CM | POA: Diagnosis not present

## 2022-04-29 DIAGNOSIS — E785 Hyperlipidemia, unspecified: Secondary | ICD-10-CM

## 2022-04-29 DIAGNOSIS — I7121 Aneurysm of the ascending aorta, without rupture: Secondary | ICD-10-CM

## 2022-04-29 DIAGNOSIS — I700 Atherosclerosis of aorta: Secondary | ICD-10-CM

## 2022-04-29 DIAGNOSIS — I2510 Atherosclerotic heart disease of native coronary artery without angina pectoris: Secondary | ICD-10-CM

## 2022-04-29 DIAGNOSIS — J432 Centrilobular emphysema: Secondary | ICD-10-CM | POA: Diagnosis not present

## 2022-04-29 DIAGNOSIS — I779 Disorder of arteries and arterioles, unspecified: Secondary | ICD-10-CM | POA: Diagnosis not present

## 2022-04-29 DIAGNOSIS — R002 Palpitations: Secondary | ICD-10-CM

## 2022-04-29 MED ORDER — IOPAMIDOL (ISOVUE-370) INJECTION 76%
75.0000 mL | Freq: Once | INTRAVENOUS | Status: AC | PRN
  Administered 2022-04-29: 75 mL via INTRAVENOUS

## 2022-05-05 ENCOUNTER — Other Ambulatory Visit: Payer: Self-pay | Admitting: *Deleted

## 2022-05-05 DIAGNOSIS — I7121 Aneurysm of the ascending aorta, without rupture: Secondary | ICD-10-CM

## 2022-05-05 NOTE — Progress Notes (Signed)
Order placed for cta chest aorta for March 2025 per results on recent CTA.

## 2022-05-06 ENCOUNTER — Other Ambulatory Visit: Payer: Self-pay

## 2022-05-06 DIAGNOSIS — Z87891 Personal history of nicotine dependence: Secondary | ICD-10-CM

## 2022-05-06 DIAGNOSIS — F17210 Nicotine dependence, cigarettes, uncomplicated: Secondary | ICD-10-CM

## 2022-05-08 DIAGNOSIS — M810 Age-related osteoporosis without current pathological fracture: Secondary | ICD-10-CM | POA: Diagnosis not present

## 2022-05-21 ENCOUNTER — Other Ambulatory Visit: Payer: Medicare PPO

## 2022-05-25 ENCOUNTER — Ambulatory Visit (HOSPITAL_COMMUNITY)
Admission: RE | Admit: 2022-05-25 | Discharge: 2022-05-25 | Disposition: A | Discharge status: Home / Self Care | Payer: Medicare PPO | Source: Ambulatory Visit | Attending: Internal Medicine | Admitting: Internal Medicine

## 2022-05-25 DIAGNOSIS — I700 Atherosclerosis of aorta: Secondary | ICD-10-CM | POA: Insufficient documentation

## 2022-05-28 ENCOUNTER — Other Ambulatory Visit: Payer: Self-pay | Admitting: *Deleted

## 2022-05-28 ENCOUNTER — Encounter: Payer: Self-pay | Admitting: Internal Medicine

## 2022-05-28 DIAGNOSIS — I77819 Aortic ectasia, unspecified site: Secondary | ICD-10-CM

## 2022-05-28 NOTE — Progress Notes (Signed)
Order placed to repeat AAA ultrasound in one year.

## 2022-06-18 ENCOUNTER — Ambulatory Visit
Admission: RE | Admit: 2022-06-18 | Discharge: 2022-06-18 | Disposition: A | Discharge status: Home / Self Care | Payer: Medicare PPO | Source: Ambulatory Visit | Attending: Internal Medicine | Admitting: Internal Medicine

## 2022-06-18 ENCOUNTER — Ambulatory Visit (INDEPENDENT_AMBULATORY_CARE_PROVIDER_SITE_OTHER): Payer: Medicare PPO

## 2022-06-18 ENCOUNTER — Other Ambulatory Visit: Payer: Self-pay

## 2022-06-18 VITALS — BP 138/70 | HR 73 | Temp 98.1°F | Resp 16 | Ht 64.0 in | Wt 120.0 lb

## 2022-06-18 DIAGNOSIS — M25521 Pain in right elbow: Secondary | ICD-10-CM

## 2022-06-18 DIAGNOSIS — M25551 Pain in right hip: Secondary | ICD-10-CM | POA: Diagnosis not present

## 2022-06-18 MED ORDER — TIZANIDINE HCL 2 MG PO TABS: 2.0000 mg | ORAL_TABLET | Freq: Two times a day (BID) | ORAL | 0 refills | Status: DC | PRN

## 2022-06-18 MED ORDER — LIDOCAINE 5 % EX PTCH
1.0000 | MEDICATED_PATCH | CUTANEOUS | 0 refills | Status: DC
Start: 2022-06-18 — End: 2022-09-03

## 2022-06-18 NOTE — ED Triage Notes (Signed)
Patient c/o continued right hip pain x 3 week, unable to sleep at night.  No apparent injury.  Possible bursitis.  Patient has been taken Tylenol Arthritis and Ibuprofen.

## 2022-06-18 NOTE — ED Provider Notes (Signed)
[facility]    CSN: [id number] Arrival date & time: 06/18/22  1016      History   Chief Complaint Chief Complaint  Patient presents with   Hip Pain    Entered by patient    HPI [name] is a 72 y.o. female.   Patient presents with right hip pain that has been present for about 3 weeks.  Patient denies any obvious injury to the area or any history of chronic hip pain.  Patient reports that getting up and walking around helps alleviate the pain but lying flat at night flares it up.  She has taken Tylenol and ibuprofen with no improvement.  Denies numbness or tingling.   Hip Pain    Past Medical History:  Diagnosis Date   Anxiety    Aortic atherosclerosis    CMC arthritis    COPD (chronic obstructive pulmonary disease)    Coronary artery calcification seen on CAT scan    Hashimoto's thyroiditis    Hypothyroid    Hypothyroidism    Mood disorder    Osteoarthritis    Osteoporosis    Sciatic leg pain    Tobacco abuse     There are no problems to display for this patient.   Past Surgical History:  Procedure Laterality Date   ABDOMINAL HYSTERECTOMY     BREAST CYST EXCISION Bilateral    CERVICAL DISC SURGERY     CHOLECYSTECTOMY     TOTAL THYROIDECTOMY      OB History   No obstetric history on file.      Home Medications    Prior to Admission medications   Medication Sig Start Date End Date Taking? Authorizing Provider  aspirin EC 81 MG tablet Take 1 tablet (81 mg total) by mouth daily. Swallow whole. 05/01/21  Yes Orbie Pyo, MD  atorvastatin (LIPITOR) 40 MG tablet Take 1 tablet (40 mg total) by mouth daily. 05/01/21  Yes Orbie Pyo, MD  Cholecalciferol (VITAMIN D-3) 25 MCG (1000 UT) CAPS Take by mouth.   Yes [provider]  citalopram (CELEXA) 20 MG tablet Take 20 mg by mouth daily.   Yes [provider]  denosumab (PROLIA) 60 MG/ML SOSY injection Inject 60 mg into the skin every 6 (six) months.   Yes  [provider]  isosorbide mononitrate (IMDUR) 30 MG 24 hr tablet TAKE 1 TABLET BY MOUTH AT BEDTIME. 04/10/22  Yes Orbie Pyo, MD  lidocaine (LIDODERM) 5 % Place 1 patch onto the skin daily. Remove & Discard patch within 12 hours or as directed by MD 06/18/22  Yes Gustavus Bryant, FNP  loratadine (CLARITIN) 10 MG tablet Take 10 mg by mouth daily.   Yes [provider]  meclizine (ANTIVERT) 25 MG tablet Take 25 mg by mouth 3 (three) times daily as needed for dizziness.   Yes [provider]  SYNTHROID 100 MCG tablet Take 100 mcg by mouth every morning. 08/19/21  Yes [provider]  tiZANidine (ZANAFLEX) 2 MG tablet Take 1 tablet (2 mg total) by mouth every 12 (twelve) hours as needed for muscle spasms. 06/18/22  Yes Sadae Arrazola, Acie Fredrickson, FNP  doxycycline (VIBRA-TABS) 100 MG tablet Take 1 tablet (100 mg total) by mouth 2 (two) times daily. 1 po bid 03/14/22   Daphine Deutscher, Mary-Margaret, FNP  levothyroxine (SYNTHROID, LEVOTHROID) 112 MCG tablet Take 112 mcg by mouth daily. Patient not taking: Reported on 08/21/2021    [provider]    Family History  History reviewed. No pertinent family history.  Social History Social History   Tobacco Use   Smoking status: Every Day    Packs/day: 1.00    Years: 47.00    Additional pack years: 0.00    Total pack years: 47.00    Types: Cigarettes   Smokeless tobacco: Never  Vaping Use   Vaping Use: Never used  Substance Use Topics   Alcohol use: No   Drug use: No     Allergies   Penicillins, Chantix [varenicline], Neurontin [gabapentin], and Pseudoephedrine hcl   Review of Systems Review of Systems Per HPI  Physical Exam Triage Vital Signs ED Triage Vitals  Enc Vitals Group     BP 06/18/22 1046 138/70     Pulse Rate 06/18/22 1046 73     Resp 06/18/22 1046 16     Temp 06/18/22 1046 98.1 F (36.7 C)     Temp Source 06/18/22 1046 Oral     SpO2 06/18/22 1046 96 %     Weight 06/18/22 1048 120 lb (54.4  kg)     Height 06/18/22 1048 5\' 4"  (1.626 m)     Head Circumference --      Peak Flow --      Pain Score 06/18/22 1048 5     Pain Loc --      Pain Edu? --      Excl. in GC? --    No data found.  Updated Vital Signs BP 138/70 (BP Location: Left Arm)   Pulse 73   Temp 98.1 F (36.7 C) (Oral)   Resp 16   Ht 5\' 4"  (1.626 m)   Wt 120 lb (54.4 kg)   SpO2 96%   BMI 20.60 kg/m   Visual Acuity Right Eye Distance:   Left Eye Distance:   Bilateral Distance:    Right Eye Near:   Left Eye Near:    Bilateral Near:     Physical Exam Constitutional:      General: She is not in acute distress.    Appearance: Normal appearance. She is not toxic-appearing or diaphoretic.  HENT:     Head: Normocephalic and atraumatic.  Eyes:     Extraocular Movements: Extraocular movements intact.     Conjunctiva/sclera: Conjunctivae normal.  Pulmonary:     Effort: Pulmonary effort is normal.  Musculoskeletal:     Comments: Patient has tenderness to palpation to right lateral anterior hip.  There is no obvious swelling, discoloration, lacerations, crepitus noted.  Patient can bear weight and has full range of motion of hip. Neurovascularly intact.   Neurological:     General: No focal deficit present.     Mental Status: She is alert and oriented to person, place, and time. Mental status is at baseline.  Psychiatric:        Mood and Affect: Mood normal.        Behavior: Behavior normal.        Thought Content: Thought content normal.        Judgment: Judgment normal.      UC Treatments / Results  Labs (all labs ordered are listed, but only abnormal results are displayed) Labs Reviewed - No data to display  EKG   Radiology DG Hip Unilat W or Wo Pelvis 2-3 Views Right  Result Date: 06/18/2022 CLINICAL DATA:  Hip pain EXAM: DG HIP (WITH OR WITHOUT PELVIS) 2-3V RIGHT COMPARISON:  None Available. FINDINGS: Osseous demineralization. Hip and SI joint spaces preserved. No definite fracture,  dislocation,  or bone destruction. Scattered pelvic phleboliths. IMPRESSION: No definite acute osseous abnormalities. If patient has persistent unexplained pain, consider MR assessment. Electronically Signed   By: Ulyses Southward M.D.   On: 06/18/2022 11:35    Procedures Procedures (including critical care time)  Medications Ordered in UC Medications - No data to display  Initial Impression / Assessment and Plan / UC Course  I have reviewed the triage vital signs and the nursing notes.  Pertinent labs & imaging results that were available during my care of the patient were reviewed by me and considered in my medical decision making (see chart for details).     Right hip x-ray was negative for any bony normality.  It showed vascular phleboliths which is most likely just incidental finding especially given patient's age.  Differential diagnoses include muscular strain/injury versus joint pain. limited options on pain management given patient's history of abdominal aortic aneurysm and cardiac history.  Will avoid prednisone or NSAIDs.  Tylenol as needed.  Will prescribe lidocaine patch to apply topically and tizanidine muscle relaxer to take as needed.  Patient has taken muscle relaxer before and denies that she takes any sedating medications so that should be safe.  Reminded patient this medication can make her drowsy so do not drive or drink alcohol while taking it.  Advised following up with orthopedist at provided contact information for further evaluation and management.  Patient verbalized understanding and was agreeable with plan. Final Clinical Impressions(s) / UC Diagnoses   Final diagnoses:  Right hip pain     Discharge Instructions      I have prescribed you a lidocaine patch and muscle relaxer.  Please be advised that muscle relaxer can make you drowsy so do not drive or drink alcohol with taking it.  Follow-up with orthopedist.  The orthopedic specialist that I have provided for you is  a walk-in clinic or you may make an appointment.     ED Prescriptions     Medication Sig Dispense Auth. Provider   tiZANidine (ZANAFLEX) 2 MG tablet Take 1 tablet (2 mg total) by mouth every 12 (twelve) hours as needed for muscle spasms. 20 tablet [place], Roxana E, [place]   lidocaine (LIDODERM) 5 % Place 1 patch onto the skin daily. Remove & Discard patch within 12 hours or as directed by MD 30 patch Caralyn Twining, Acie Fredrickson, FNP      PDMP not reviewed this encounter.   Gustavus Bryant, [place] 06/18/22 1157

## 2022-06-18 NOTE — Discharge Instructions (Signed)
I have prescribed you a lidocaine patch and muscle relaxer.  Please be advised that muscle relaxer can make you drowsy so do not drive or drink alcohol with taking it.  Follow-up with orthopedist.  The orthopedic specialist that I have provided for you is a walk-in clinic or you may make an appointment.

## 2022-06-19 DIAGNOSIS — M7061 Trochanteric bursitis, right hip: Secondary | ICD-10-CM | POA: Diagnosis not present

## 2022-06-19 DIAGNOSIS — M1611 Unilateral primary osteoarthritis, right hip: Secondary | ICD-10-CM | POA: Diagnosis not present

## 2022-07-16 DIAGNOSIS — M1611 Unilateral primary osteoarthritis, right hip: Secondary | ICD-10-CM | POA: Diagnosis not present

## 2022-07-16 DIAGNOSIS — M7061 Trochanteric bursitis, right hip: Secondary | ICD-10-CM | POA: Diagnosis not present

## 2022-07-27 DIAGNOSIS — M7061 Trochanteric bursitis, right hip: Secondary | ICD-10-CM | POA: Diagnosis not present

## 2022-07-27 DIAGNOSIS — M1611 Unilateral primary osteoarthritis, right hip: Secondary | ICD-10-CM | POA: Diagnosis not present

## 2022-08-30 NOTE — Progress Notes (Unsigned)
Cardiology Office Note:    Date:  08/30/2022   ID:  [name], DOB [birth year], MRN [id number]  PCP:  Blair Heys, MD   [facility] Providers Cardiologist:  Alverda Skeans, MD Referring MD: Blair Heys, MD   Chief Complaint/Reason for Referral:  Coronary calcification  ASSESSMENT:    1. Coronary artery calcification seen on CAT scan   2. Aortic dilatation (HCC)   3. Aortic atherosclerosis (HCC)   4. Hyperlipidemia LDL goal <70   5. Tobacco abuse     PLAN:    In order of problems listed above: 1.  Coronary artery calcification: Continue aspirin, statin, and Imdur. 2.  Aortic dilatation: Annual CTs in March. 3.  Aortic atherosclerosis: Continue aspirin statin and strict blood pressure control. 4.  Hyperlipidemia: Will check lipid panel, LFTs, and LP(a) today. 5.  Tobacco abuse: Abdominal ultrasound was negative in 2024 or aneurysm.  Will screen carotids and ABIs.         {Are you ordering a CV Procedure (e.g. stress test, cath, DCCV, TEE, etc)?   Press F2        :[id number]}   Dispo:  No follow-ups on file.      Medication Adjustments/Labs and Tests Ordered: Current medicines are reviewed at length with the patient today.  Concerns regarding medicines are outlined above.  The following changes have been made:  {PLAN; NO CHANGE:13088:s}   Labs/tests ordered: No orders of the defined types were placed in this encounter.   Medication Changes: No orders of the defined types were placed in this encounter.   Current medicines are reviewed at length with the patient today.  The patient {ACTIONS; HAS/DOES NOT HAVE:19233} concerns regarding medicines.  History of Present Illness:    FOCUSED PROBLEM LIST:   1.  Hypothyroidism 2.  Ongoing tobacco abuse 3.  Aortic atherosclerosis on CT scan 4.  Coronary artery calcification on CT scan 5.  COPD 6.  Ascending aortic aneurysm on chest CT March 2023 7.  Hyperlipidemia     The patient is a 72 y.o.  female with the indicated medical history here for follow-up.     March 2023.  The patient was seen for initial consultation regarding coronary artery calcification and occasional chest pain she.  The pain occurred very infrequently.  She was started on Imdur 30 mg.  Given her history of wheezing beta-blocker was deferred.  She is referred for abdominal ultrasound given her history of smoking.   June 2023: The patient is doing well.  She denies any exertional angina.  She denies any significant dyspnea.  She does note in the evening she will get palpitations at times associated with fatigue.  Palpitations do not seem to be associate with chest pain or shortness of breath.  She denies any presyncope or syncope.  She has not required emergency room visits or hospitalizations for breathing or chest pain issues.  She denies any claudication.  Plan:  Continue medication therapy, obtain monitor and chest CT annually in March for thoracic aneurysm monitoring.  Today: In the interim the patient had a chest CT which showed ascending thoracic aorta measuring 4.0 cm.  Monitor demonstrated occasional SVT PVCs, and PACs.       Previous Medical History: Past Medical History:  Diagnosis Date   Anxiety    Aortic atherosclerosis (HCC)    CMC arthritis    COPD (chronic obstructive pulmonary disease) (HCC)    Coronary artery calcification seen on CAT scan    Hashimoto's thyroiditis  Hypothyroid    Hypothyroidism    Mood disorder (HCC)    Osteoarthritis    Osteoporosis    Sciatic leg pain    Tobacco abuse      Current Medications: No outpatient medications have been marked as taking for the 09/03/22 encounter (Office Visit) with Orbie Pyo, MD.     Allergies:    Penicillins, Chantix [varenicline], Neurontin [gabapentin], and Pseudoephedrine hcl   Social History:   Social History   Tobacco Use   Smoking status: Every Day    Packs/day: 1.00    Years: 47.00    Additional pack years: 0.00     Total pack years: 47.00    Types: Cigarettes   Smokeless tobacco: Never  Vaping Use   Vaping Use: Never used  Substance Use Topics   Alcohol use: No   Drug use: No     Family Hx: No family history on file.   Review of Systems:   Please see the history of present illness.    All other systems reviewed and are negative.     EKGs/Labs/Other Test Reviewed:    EKG:    EKG Interpretation Date/Time:    Ventricular Rate:    PR Interval:    QRS Duration:    QT Interval:    QTC Calculation:   R Axis:      Text Interpretation:           Prior CV studies reviewed:  Cardiac Studies & Procedures       ECHOCARDIOGRAM  ECHOCARDIOGRAM COMPLETE 05/12/2021  Narrative ECHOCARDIOGRAM REPORT    Patient Name:   [name] Date of Exam: 05/12/2021 Medical Rec #:  [id number]        Height:       64.5 in Accession #:    [id number]       Weight:       123.8 lb Date of Birth:  05-13-1950        BSA:          1.604 m Patient Age:    70 years         BP:           103/59 mmHg Patient Gender: F                HR:           78 bpm. Exam Location:  [address]  Procedure: 2D Echo, 3D Echo, Cardiac Doppler and Color Doppler  Indications:    R07.9 Chest Pain  History:        Patient has no prior history of Echocardiogram examinations. Risk Factors:HLD.  Sonographer:    Clearence Ped RCS Referring Phys: [id number] Orbie Pyo  IMPRESSIONS   1. Left ventricular ejection fraction, by estimation, is 60 to 65%. The left ventricle has normal function. The left ventricle has no regional wall motion abnormalities. Left ventricular diastolic parameters were normal. 2. Right ventricular systolic function is normal. The right ventricular size is normal. There is normal pulmonary artery systolic pressure. 3. Thickened mitral valve leaflets with mild posterior leaflet prolapse. Mild mitral valve regurgitation. No evidence of mitral stenosis. There is mild prolapse of posterior  leaflet of the mitral valve. 4. The aortic valve is tricuspid. Aortic valve regurgitation is mild to moderate. No aortic stenosis is present. 5. The inferior vena cava is normal in size with greater than 50% respiratory variability, suggesting right atrial pressure of 3 mmHg.  Comparison(s): No prior Echocardiogram.  FINDINGS  Left Ventricle: Left ventricular ejection fraction, by estimation, is 60 to 65%. The left ventricle has normal function. The left ventricle has no regional wall motion abnormalities. The left ventricular internal cavity size was normal in size. There is no left ventricular hypertrophy. Left ventricular diastolic parameters were normal.  Right Ventricle: The right ventricular size is normal. No increase in right ventricular wall thickness. Right ventricular systolic function is normal. There is normal pulmonary artery systolic pressure. The tricuspid regurgitant velocity is 1.59 m/s, and with an assumed right atrial pressure of 3 mmHg, the estimated right ventricular systolic pressure is 13.1 mmHg.  Left Atrium: Left atrial size was normal in size.  Right Atrium: Right atrial size was normal in size.  Pericardium: There is no evidence of pericardial effusion.  Mitral Valve: Thickened mitral valve leaflets with mild posterior leaflet prolapse. There is mild prolapse of posterior leaflet of the mitral valve. There is moderate thickening of the mitral valve leaflet(s). Mild mitral valve regurgitation. No evidence of mitral valve stenosis.  Tricuspid Valve: The tricuspid valve is normal in structure. Tricuspid valve regurgitation is trivial. No evidence of tricuspid stenosis.  Aortic Valve: The aortic valve is tricuspid. Aortic valve regurgitation is mild to moderate. Aortic regurgitation PHT measures 728 msec. No aortic stenosis is present.  Pulmonic Valve: The pulmonic valve was not well visualized. Pulmonic valve regurgitation is trivial. No evidence of pulmonic  stenosis.  Aorta: The aortic root is normal in size and structure.  Venous: The inferior vena cava is normal in size with greater than 50% respiratory variability, suggesting right atrial pressure of 3 mmHg.  IAS/Shunts: No atrial level shunt detected by color flow Doppler.   LEFT VENTRICLE PLAX 2D LVIDd:         4.30 cm   Diastology LVIDs:         3.00 cm   LV e' medial:    6.53 cm/s LV PW:         0.80 cm   LV E/e' medial:  10.9 LV IVS:        0.70 cm   LV e' lateral:   9.46 cm/s LVOT diam:     1.80 cm   LV E/e' lateral: 7.5 LV SV:         58 LV SV Index:   36 LVOT Area:     2.54 cm  3D Volume EF: 3D EF:        55 % LV EDV:       71 ml LV ESV:       32 ml LV SV:        39 ml  RIGHT VENTRICLE RV Basal diam:  2.50 cm RV S prime:     14.00 cm/s TAPSE (M-mode): 2.2 cm RVSP:           13.1 mmHg  LEFT ATRIUM             Index        RIGHT ATRIUM           Index LA diam:        2.90 cm 1.81 cm/m   RA Pressure: 3.00 mmHg LA Vol (A2C):   37.2 ml 23.19 ml/m  RA Area:     6.34 cm LA Vol (A4C):   23.1 ml 14.40 ml/m  RA Volume:   9.77 ml   6.09 ml/m LA Biplane Vol: 33.1 ml 20.63 ml/m AORTIC VALVE LVOT Vmax:   91.00 cm/s LVOT Vmean:  56.300 cm/s  LVOT VTI:    0.226 m AI PHT:      728 msec  AORTA Ao Root diam: 3.30 cm Ao Asc diam:  3.45 cm  MITRAL VALVE                TRICUSPID VALVE MV Area (PHT):              TR Peak grad:   10.1 mmHg MV Decel Time:              TR Vmax:        159.00 cm/s MV E velocity: 71.30 cm/s   Estimated RAP:  3.00 mmHg MV A velocity: 119.00 cm/s  RVSP:           13.1 mmHg MV E/A ratio:  0.60 SHUNTS Systemic VTI:  0.23 m Systemic Diam: 1.80 cm  Kardie Tobb DO Electronically signed by Thomasene Ripple DO Signature Date/Time: 05/12/2021/5:36:04 PM    Final    MONITORS  LONG TERM MONITOR (3-14 DAYS) 09/03/2021  Narrative Patch Wear Time:  3 days and 4 hours (2023-06-29T10:41:56-0400 to 2023-07-02T15:20:35-0400)  Patient had a min HR of  53 bpm, max HR of 130 bpm, and avg HR of 76 bpm. Predominant underlying rhythm was Sinus Rhythm.  EVENTS: 2 Supraventricular Tachycardia runs occurred, the run with the fastest interval lasting 7 beats with a max rate of 130 bpm (avg 124 bpm); the run with the fastest interval was also the longest.  Isolated SVEs were frequent (6.0%, 19835), SVE Couplets were rare (<1.0%, 157), and SVE Triplets were rare (<1.0%, 4). Isolated VEs were rare (<1.0%), VE Couplets were rare (<1.0%), and no VE Triplets were present. Ventricular Bigeminy was present.  No atrial fibrillation, ventricular tachyarrhythmias, or bradyarrhythmias were detected.           Other studies Reviewed: Review of the additional studies/records demonstrates: Aortic atherosclerosis on chest CT 2023  Recent Labs: No results found for requested labs within last 365 days.   Lipid Panel    Component Value Date/Time   CHOL 125 08/12/2021 0753   TRIG 77 08/12/2021 0753   HDL 57 08/12/2021 0753   CHOLHDL 2.2 08/12/2021 0753   LDLCALC 53 08/12/2021 0753    Risk Assessment/Calculations:    {Does this patient have ATRIAL FIBRILLATION?:[phone]}      No BP recorded.  {Refresh Note OR Click here to enter BP  :1}***    Physical Exam:    VS:  There were no vitals taken for this visit.   Wt Readings from Last 3 Encounters:  06/18/22 120 lb (54.4 kg)  08/21/21 122 lb 9.6 oz (55.6 kg)  05/01/21 123 lb 12.8 oz (56.2 kg)    No BP recorded.  {Refresh Note OR Click here to enter BP  :1}***    GENERAL:  No apparent distress, AOx3 HEENT:  No carotid bruits, +2 carotid impulses, no scleral icterus CAR: RRR Irregular RR*** no murmurs***, gallops, rubs, or thrills RES:  Clear to auscultation bilaterally ABD:  Soft, nontender, nondistended, positive bowel sounds x 4 VASC:  +2 radial pulses, +2 carotid pulses, palpable pedal pulses NEURO:  CN 2-12 grossly intact; motor and sensory grossly intact PSYCH:  No active depression or  anxiety EXT:  No edema, ecchymosis, or cyanosis  Signed, Orbie Pyo, MD  08/30/2022 10:16 AM    [facility] [address] [place], [place], [place]  [address] Phone: [phone]; Fax: [phone]   Note:  This document was prepared  using Conservation officer, historic buildings and may include unintentional dictation errors.

## 2022-09-03 ENCOUNTER — Encounter: Payer: Self-pay | Admitting: Internal Medicine

## 2022-09-03 ENCOUNTER — Ambulatory Visit: Payer: Medicare PPO | Attending: Internal Medicine | Admitting: Internal Medicine

## 2022-09-03 VITALS — BP 128/66 | HR 83 | Ht 64.0 in | Wt 116.0 lb

## 2022-09-03 DIAGNOSIS — R002 Palpitations: Secondary | ICD-10-CM | POA: Diagnosis not present

## 2022-09-03 DIAGNOSIS — I2510 Atherosclerotic heart disease of native coronary artery without angina pectoris: Secondary | ICD-10-CM

## 2022-09-03 DIAGNOSIS — I700 Atherosclerosis of aorta: Secondary | ICD-10-CM | POA: Diagnosis not present

## 2022-09-03 DIAGNOSIS — I77819 Aortic ectasia, unspecified site: Secondary | ICD-10-CM

## 2022-09-03 DIAGNOSIS — Z720 Tobacco use: Secondary | ICD-10-CM

## 2022-09-03 DIAGNOSIS — E785 Hyperlipidemia, unspecified: Secondary | ICD-10-CM | POA: Diagnosis not present

## 2022-09-03 NOTE — Patient Instructions (Signed)
Medication Instructions:  No changes *If you need a refill on your cardiac medications before your next appointment, please call your pharmacy*   Lab Work: none If you have labs (blood work) drawn today and your tests are completely normal, you will receive your results only by: MyChart Message (if you have MyChart) OR A paper copy in the mail If you have any lab test that is abnormal or we need to change your treatment, we will call you to review the results.   Testing/Procedures: Vascuscreening - ABIs and carotids   Follow-Up: At [facility], you and your health needs are our priority.  As part of our continuing mission to provide you with exceptional heart care, we have created designated Provider Care Teams.  These Care Teams include your primary Cardiologist (physician) and Advanced Practice Providers (APPs -  Physician Assistants and Nurse Practitioners) who all work together to provide you with the care you need, when you need it.   Your next appointment:   12 month(s)  Provider:   Orbie Pyo, MD

## 2022-09-08 ENCOUNTER — Ambulatory Visit (HOSPITAL_COMMUNITY)
Admission: RE | Admit: 2022-09-08 | Discharge: 2022-09-08 | Disposition: A | Discharge status: Home / Self Care | Payer: Medicare PPO | Source: Ambulatory Visit | Attending: Cardiology | Admitting: Cardiology

## 2022-09-08 DIAGNOSIS — I2510 Atherosclerotic heart disease of native coronary artery without angina pectoris: Secondary | ICD-10-CM

## 2022-09-08 DIAGNOSIS — Z720 Tobacco use: Secondary | ICD-10-CM

## 2022-09-08 DIAGNOSIS — I700 Atherosclerosis of aorta: Secondary | ICD-10-CM

## 2022-09-14 ENCOUNTER — Encounter (HOSPITAL_COMMUNITY): Payer: Self-pay | Admitting: Cardiology

## 2022-10-02 ENCOUNTER — Other Ambulatory Visit: Payer: Self-pay | Admitting: Internal Medicine

## 2022-11-05 DIAGNOSIS — E039 Hypothyroidism, unspecified: Secondary | ICD-10-CM | POA: Diagnosis not present

## 2022-11-05 DIAGNOSIS — E063 Autoimmune thyroiditis: Secondary | ICD-10-CM | POA: Diagnosis not present

## 2022-11-05 DIAGNOSIS — Z23 Encounter for immunization: Secondary | ICD-10-CM | POA: Diagnosis not present

## 2022-11-05 DIAGNOSIS — M810 Age-related osteoporosis without current pathological fracture: Secondary | ICD-10-CM | POA: Diagnosis not present

## 2022-11-05 DIAGNOSIS — Z720 Tobacco use: Secondary | ICD-10-CM | POA: Diagnosis not present

## 2022-11-16 DIAGNOSIS — M810 Age-related osteoporosis without current pathological fracture: Secondary | ICD-10-CM | POA: Diagnosis not present

## 2023-02-05 DIAGNOSIS — D3132 Benign neoplasm of left choroid: Secondary | ICD-10-CM | POA: Diagnosis not present

## 2023-02-05 DIAGNOSIS — H02834 Dermatochalasis of left upper eyelid: Secondary | ICD-10-CM | POA: Diagnosis not present

## 2023-02-05 DIAGNOSIS — H04123 Dry eye syndrome of bilateral lacrimal glands: Secondary | ICD-10-CM | POA: Diagnosis not present

## 2023-02-05 DIAGNOSIS — Z961 Presence of intraocular lens: Secondary | ICD-10-CM | POA: Diagnosis not present

## 2023-02-05 DIAGNOSIS — H02831 Dermatochalasis of right upper eyelid: Secondary | ICD-10-CM | POA: Diagnosis not present

## 2023-03-09 ENCOUNTER — Telehealth: Payer: Medicare PPO | Admitting: Nurse Practitioner

## 2023-03-09 DIAGNOSIS — J0140 Acute pansinusitis, unspecified: Secondary | ICD-10-CM | POA: Diagnosis not present

## 2023-03-09 DIAGNOSIS — R051 Acute cough: Secondary | ICD-10-CM

## 2023-03-09 MED ORDER — BENZONATATE 100 MG PO CAPS: 100.0000 mg | ORAL_CAPSULE | Freq: Three times a day (TID) | ORAL | 0 refills | Status: AC | PRN

## 2023-03-09 MED ORDER — DOXYCYCLINE HYCLATE 100 MG PO TABS: 100.0000 mg | ORAL_TABLET | Freq: Two times a day (BID) | ORAL | 0 refills | Status: AC

## 2023-03-09 NOTE — Progress Notes (Signed)
 E-Visit for Sinus Problems  We are sorry that you are not feeling well.  Here is how we plan to help!  Based on what you have shared with me it looks like you have sinusitis.  Sinusitis is inflammation and infection in the sinus cavities of the head.  Based on your presentation I believe you most likely have Acute Bacterial Sinusitis.  This is an infection caused by bacteria and is treated with antibiotics. I have prescribed Doxycycline  100mg  by mouth twice a day for 7 days and tessalon  pearls to help with your cough. You may use an oral decongestant such as Mucinex D or if you have glaucoma or high blood pressure use plain Mucinex. Saline nasal spray help and can safely be used as often as needed for congestion.  If you develop worsening sinus pain, fever or notice severe headache and vision changes, or if symptoms are not better after completion of antibiotic, please schedule an appointment with a health care provider.    Sinus infections are not as easily transmitted as other respiratory infection, however we still recommend that you avoid close contact with loved ones, especially the very young and elderly.  Remember to wash your hands thoroughly throughout the day as this is the number one way to prevent the spread of infection!  Home Care: Only take medications as instructed by your medical team. Complete the entire course of an antibiotic. Do not take these medications with alcohol. A steam or ultrasonic humidifier can help congestion.  You can place a towel over your head and breathe in the steam from hot water coming from a faucet. Avoid close contacts especially the very young and the elderly. Cover your mouth when you cough or sneeze. Always remember to wash your hands.  Get Help Right Away If: You develop worsening fever or sinus pain. You develop a severe head ache or visual changes. Your symptoms persist after you have completed your treatment plan.  Make sure you Understand  these instructions. Will watch your condition. Will get help right away if you are not doing well or get worse.  Thank you for choosing an e-visit.  Your e-visit answers were reviewed by a board certified advanced clinical practitioner to complete your personal care plan. Depending upon the condition, your plan could have included both over the counter or prescription medications.  Please review your pharmacy choice. Make sure the pharmacy is open so you can pick up prescription now. If there is a problem, you may contact your provider through [employer] and have the prescription routed to another pharmacy.  Your safety is important to us . If you have drug allergies check your prescription carefully.   For the next 24 hours you can use MyChart to ask questions about today's visit, request a non-urgent call back, or ask for a work or school excuse. You will get an email in the next two days asking about your experience. I hope that your e-visit has been valuable and will speed your recovery.   I spent approximately 5 minutes reviewing the patient's history, current symptoms and coordinating their care today.

## 2023-04-12 ENCOUNTER — Other Ambulatory Visit: Payer: Self-pay | Admitting: Family Medicine

## 2023-04-12 DIAGNOSIS — I719 Aortic aneurysm of unspecified site, without rupture: Secondary | ICD-10-CM

## 2023-04-16 ENCOUNTER — Other Ambulatory Visit: Payer: Medicare PPO

## 2023-04-19 ENCOUNTER — Ambulatory Visit
Admission: RE | Admit: 2023-04-19 | Discharge: 2023-04-19 | Disposition: A | Discharge status: Home / Self Care | Payer: Medicare PPO | Source: Ambulatory Visit | Attending: Family Medicine | Admitting: Family Medicine

## 2023-04-19 DIAGNOSIS — I719 Aortic aneurysm of unspecified site, without rupture: Secondary | ICD-10-CM

## 2023-04-21 ENCOUNTER — Other Ambulatory Visit: Payer: Self-pay | Admitting: Acute Care

## 2023-04-21 ENCOUNTER — Other Ambulatory Visit: Payer: Self-pay | Admitting: Family Medicine

## 2023-04-21 DIAGNOSIS — F17210 Nicotine dependence, cigarettes, uncomplicated: Secondary | ICD-10-CM

## 2023-04-21 DIAGNOSIS — Z0000 Encounter for general adult medical examination without abnormal findings: Secondary | ICD-10-CM

## 2023-04-21 DIAGNOSIS — Z87891 Personal history of nicotine dependence: Secondary | ICD-10-CM

## 2023-04-22 ENCOUNTER — Encounter: Payer: Self-pay | Admitting: Internal Medicine

## 2023-04-22 ENCOUNTER — Other Ambulatory Visit: Payer: Self-pay

## 2023-04-22 DIAGNOSIS — I7121 Aneurysm of the ascending aorta, without rupture: Secondary | ICD-10-CM

## 2023-04-22 NOTE — Progress Notes (Signed)
 Referral to vascular surgery placed per Dr. Lynnette Caffey.

## 2023-04-27 ENCOUNTER — Ambulatory Visit
Admission: RE | Admit: 2023-04-27 | Discharge: 2023-04-27 | Disposition: A | Discharge status: Home / Self Care | Payer: Medicare PPO | Source: Ambulatory Visit | Attending: Family Medicine | Admitting: Family Medicine

## 2023-04-27 DIAGNOSIS — Z0000 Encounter for general adult medical examination without abnormal findings: Secondary | ICD-10-CM

## 2023-04-29 ENCOUNTER — Encounter (HOSPITAL_COMMUNITY): Payer: Self-pay

## 2023-05-03 ENCOUNTER — Ambulatory Visit: Admitting: Surgery

## 2023-05-03 ENCOUNTER — Other Ambulatory Visit: Payer: Self-pay

## 2023-05-03 ENCOUNTER — Encounter: Payer: Self-pay | Admitting: Surgery

## 2023-05-03 VITALS — BP 147/71 | HR 88 | Temp 97.9°F | Ht 64.0 in | Wt 107.7 lb

## 2023-05-03 DIAGNOSIS — I7143 Infrarenal abdominal aortic aneurysm, without rupture: Secondary | ICD-10-CM | POA: Diagnosis not present

## 2023-05-03 NOTE — Progress Notes (Signed)
 [facility]ist [facility]  Patient name: [name] MRN: [id number] DOB: 1950-08-24 Sex: female   REQUESTING PROVIDER:    Dr. Lynnette Caffey   REASON FOR CONSULT:    AAA  HISTORY OF PRESENT ILLNESS:   [name] is a 73 y.o. female, who is referred for evaluation of an abdominal aortic aneurysm that was detected on screening ultrasound measuring 5.2 cm in maximum diameter.  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  She takes a statin for hypercholesterolemia.  She is medically managed for hypertension.  She has a family history of aneurysmal disease in her father.  She says that she has been having abdominal pain on occasion for several months  PAST MEDICAL HISTORY    Past Medical History:  Diagnosis Date   Anxiety    Aortic atherosclerosis (HCC)    CMC arthritis    COPD (chronic obstructive pulmonary disease) (HCC)    Coronary artery calcification seen on CAT scan    Hashimoto's thyroiditis    Hypothyroid    Hypothyroidism    Mood disorder (HCC)    Osteoarthritis    Osteoporosis    Sciatic leg pain    Tobacco abuse      FAMILY HISTORY   History reviewed. No pertinent family history.  SOCIAL HISTORY:   Social History   Socioeconomic History   Marital status: Married    Spouse name: Not on file   Number of children: Not on file   Years of education: Not on file   Highest education level: Not on file  Occupational History   Not on file  Tobacco Use   Smoking status: Every Day    Current packs/day: 1.00    Average packs/day: 1 pack/day for 47.0 years (47.0 ttl pk-yrs)    Types: Cigarettes   Smokeless tobacco: Never  Vaping Use   Vaping status: Never Used  Substance and Sexual Activity   Alcohol use: No   Drug use: No   Sexual activity: Not Currently  Other Topics Concern   Not on file  Social History Narrative   Not on file   Social Drivers of Health   Financial  Resource Strain: Not on file  Food Insecurity: Not on file  Transportation Needs: Not on file  Physical Activity: Not on file  Stress: Not on file  Social Connections: Not on file  Intimate Partner Violence: Not on file    ALLERGIES:    Allergies  Allergen Reactions   Penicillins     Swollen lips, tongue, eyelids.    Chantix [Varenicline]     Other reaction(s): nausea   Neurontin [Gabapentin]    Pseudoephedrine Hcl     Other reaction(s): heart flutters    CURRENT MEDICATIONS:    Current Outpatient Medications  Medication Sig Dispense Refill   citalopram (CELEXA) 40 MG tablet Take 40 mg by mouth daily.     aspirin EC 81 MG tablet Take 1 tablet (81 mg total) by mouth daily. Swallow whole. 90 tablet 3   atorvastatin (LIPITOR) 40 MG tablet Take 1 tablet (40 mg total) by mouth daily. 90 tablet 3   benzonatate (TESSALON) 100 MG capsule Take 1 capsule (100 mg total) by mouth 3 (three) times daily as needed. 30 capsule 0   Cholecalciferol (VITAMIN D-3) 25 MCG (1000 UT) CAPS Take by mouth.     denosumab (PROLIA) 60 MG/ML SOSY injection Inject 60 mg into the skin every 6 (six) months.  isosorbide mononitrate (IMDUR) 30 MG 24 hr tablet TAKE 1 TABLET BY MOUTH EVERYDAY AT BEDTIME 90 tablet 3   loratadine (CLARITIN) 10 MG tablet Take 10 mg by mouth daily.     meclizine (ANTIVERT) 25 MG tablet Take 25 mg by mouth 3 (three) times daily as needed for dizziness.     SYNTHROID 100 MCG tablet Take 100 mcg by mouth every morning.     No current facility-administered medications for this visit.    REVIEW OF SYSTEMS:   [X]  denotes positive finding, [ ]  denotes negative finding Cardiac  Comments:  Chest pain or chest pressure:    Shortness of breath upon exertion:    Short of breath when lying flat:    Irregular heart rhythm:        Vascular    Pain in calf, thigh, or hip brought on by ambulation:    Pain in feet at night that wakes you up from your sleep:     Blood clot in your  veins:    Leg swelling:         Pulmonary    Oxygen at home:    Productive cough:     Wheezing:         Neurologic    Sudden weakness in arms or legs:     Sudden numbness in arms or legs:     Sudden onset of difficulty speaking or slurred speech:    Temporary loss of vision in one eye:     Problems with dizziness:         Gastrointestinal    Blood in stool:      Vomited blood:         Genitourinary    Burning when urinating:     Blood in urine:        Psychiatric    Major depression:         Hematologic    Bleeding problems:    Problems with blood clotting too easily:        Skin    Rashes or ulcers:        Constitutional    Fever or chills:     PHYSICAL EXAM:   Vitals:   05/03/23 1514  BP: (!) 147/71  Pulse: 88  Temp: 97.9 F (36.6 C)  TempSrc: Temporal  SpO2: 95%  Weight: 107 lb 11.2 oz (48.9 kg)  Height: 5\' 4"  (1.626 m)    GENERAL: The patient is a well-nourished female, in no acute distress. The vital signs are documented above. CARDIAC: There is a regular rate and rhythm.  VASCULAR: Palpable pedal pulses PULMONARY: Nonlabored respirations ABDOMEN: Soft and mildly tender all over MUSCULOSKELETAL: There are no major deformities or cyanosis. NEUROLOGIC: No focal weakness or paresthesias are detected. SKIN: There are no ulcers or rashes noted. PSYCHIATRIC: The patient has a normal affect.  STUDIES:   I have reviewed the following: Right Carotid: Normal: little or no evidence of plaque visualized in the  right internal carotid artery.  Left Carotid: Normal: little or no evidence of plaque visualized in the  left internal carotid artery.    Right ABI: The right ankle brachial index is within normal limits,  suggesting the absence of significant arterial obstruction at rest.  Left ABI: The left ankle brachial index is within normal limits,  suggesting the absence of significant arterial obstruction at rest.     Abdominal aorta: 1. Proximal and  mid abdominal aortic aneurysms measuring 5.2 x 3.6 cm and  3.0 x 2.3 cm, respectively.    ASSESSMENT and PLAN   AAA: Ultrasound shows a 5.2 cm aneurysm.  I discussed with her that I would need a CT angiogram to confirm the size and to see what our options are for repair.  I am also going to scan her chest to rule out aneurysmal disease.  She has a known thoracic aortic aneurysm measuring 4 cm.  She is currently having some abdominal pain but not necessarily related to her aneurysm.  It appears to be more diffuse.  I am getting a CT angiogram of the chest abdomen pelvis and I will have her follow-up with me once that has been completed within the week.   Charlena Cross, MD, FACS [facility] [phone] Pager [phone]

## 2023-05-06 ENCOUNTER — Ambulatory Visit
Admission: RE | Admit: 2023-05-06 | Discharge: 2023-05-06 | Discharge status: Home / Self Care | Source: Ambulatory Visit | Attending: Surgery | Admitting: Surgery

## 2023-05-06 DIAGNOSIS — I7143 Infrarenal abdominal aortic aneurysm, without rupture: Secondary | ICD-10-CM

## 2023-05-06 MED ORDER — IOPAMIDOL (ISOVUE-370) INJECTION 76%
75.0000 mL | Freq: Once | INTRAVENOUS | Status: AC | PRN
  Administered 2023-05-06: 75 mL via INTRAVENOUS

## 2023-05-07 ENCOUNTER — Ambulatory Visit
Admission: RE | Admit: 2023-05-07 | Discharge: 2023-05-07 | Disposition: A | Discharge status: Home / Self Care | Payer: Medicare PPO | Source: Ambulatory Visit | Attending: Acute Care | Admitting: Acute Care

## 2023-05-07 DIAGNOSIS — Z87891 Personal history of nicotine dependence: Secondary | ICD-10-CM

## 2023-05-07 DIAGNOSIS — F17210 Nicotine dependence, cigarettes, uncomplicated: Secondary | ICD-10-CM

## 2023-05-10 ENCOUNTER — Ambulatory Visit: Admitting: Surgery

## 2023-05-10 ENCOUNTER — Encounter: Payer: Self-pay | Admitting: Surgery

## 2023-05-10 VITALS — BP 111/66 | HR 100 | Temp 97.7°F | Ht 64.0 in | Wt 108.5 lb

## 2023-05-10 DIAGNOSIS — I7143 Infrarenal abdominal aortic aneurysm, without rupture: Secondary | ICD-10-CM | POA: Diagnosis not present

## 2023-05-10 NOTE — Progress Notes (Signed)
 Vascular and Vein Specialist of [facility]  Patient name: [name] MRN: [id number] DOB: 11/20/1950 Sex: female   REASON FOR VISIT:    Follow up  HISOTRY OF PRESENT ILLNESS:    [name] is a 73 y.o. female who I saw last week for an abdominal aortic aneurysm which was 5.2 cm on ultrasound.  I sent her for a CT scan to better define her anatomy.  She is back for to review these results.  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 She takes a statin for hypercholesterolemia. She is medically managed for hypertension. She has a family history of aneurysmal disease in her father. She says that she has been having abdominal pain on occasion for several months  PAST MEDICAL HISTORY:   Past Medical History:  Diagnosis Date   Anxiety    Aortic atherosclerosis (HCC)    CMC arthritis    COPD (chronic obstructive pulmonary disease) (HCC)    Coronary artery calcification seen on CAT scan    Hashimoto's thyroiditis    Hypothyroid    Hypothyroidism    Mood disorder (HCC)    Osteoarthritis    Osteoporosis    Sciatic leg pain    Tobacco abuse      FAMILY HISTORY:   History reviewed. No pertinent family history.  SOCIAL HISTORY:   Social History   Tobacco Use   Smoking status: Every Day    Current packs/day: 1.00    Average packs/day: 1 pack/day for 47.0 years (47.0 ttl pk-yrs)    Types: Cigarettes   Smokeless tobacco: Never  Substance Use Topics   Alcohol use: No     ALLERGIES:   Allergies  Allergen Reactions   Penicillins     Swollen lips, tongue, eyelids.    Chantix [Varenicline]     Other reaction(s): nausea   Neurontin [Gabapentin]    Pseudoephedrine Hcl     Other reaction(s): heart flutters     CURRENT MEDICATIONS:   Current Outpatient Medications  Medication Sig Dispense Refill   aspirin EC 81 MG tablet Take 1 tablet (81 mg total) by mouth daily. Swallow whole. 90 tablet 3    atorvastatin (LIPITOR) 40 MG tablet Take 1 tablet (40 mg total) by mouth daily. 90 tablet 3   benzonatate (TESSALON) 100 MG capsule Take 1 capsule (100 mg total) by mouth 3 (three) times daily as needed. 30 capsule 0   Cholecalciferol (VITAMIN D-3) 25 MCG (1000 UT) CAPS Take by mouth.     citalopram (CELEXA) 40 MG tablet Take 40 mg by mouth daily.     denosumab (PROLIA) 60 MG/ML SOSY injection Inject 60 mg into the skin every 6 (six) months.     isosorbide mononitrate (IMDUR) 30 MG 24 hr tablet TAKE 1 TABLET BY MOUTH EVERYDAY AT BEDTIME 90 tablet 3   loratadine (CLARITIN) 10 MG tablet Take 10 mg by mouth daily.     meclizine (ANTIVERT) 25 MG tablet Take 25 mg by mouth 3 (three) times daily as needed for dizziness.     SYNTHROID 100 MCG tablet Take 100 mcg by mouth every morning.     No current facility-administered medications for this visit.    REVIEW OF SYSTEMS:   [X]  denotes positive finding, [ ]  denotes negative finding Cardiac  Comments:  Chest pain or chest pressure:    Shortness of breath upon exertion:    Short of breath when lying flat:    Irregular heart rhythm:  Vascular    Pain in calf, thigh, or hip brought on by ambulation:    Pain in feet at night that wakes you up from your sleep:     Blood clot in your veins:    Leg swelling:         Pulmonary    Oxygen at home:    Productive cough:     Wheezing:         Neurologic    Sudden weakness in arms or legs:     Sudden numbness in arms or legs:     Sudden onset of difficulty speaking or slurred speech:    Temporary loss of vision in one eye:     Problems with dizziness:         Gastrointestinal    Blood in stool:     Vomited blood:         Genitourinary    Burning when urinating:     Blood in urine:        Psychiatric    Major depression:         Hematologic    Bleeding problems:    Problems with blood clotting too easily:        Skin    Rashes or ulcers:        Constitutional    Fever or  chills:      PHYSICAL EXAM:   Vitals:   05/10/23 1104  BP: 111/66  Pulse: 100  Temp: 97.7 F (36.5 C)  SpO2: 93%  Weight: 108 lb 8 oz (49.2 kg)  Height: 5\' 4"  (1.626 m)    GENERAL: The patient is a well-nourished female, in no acute distress. The vital signs are documented above. CARDIAC: There is a regular rate and rhythm.  PULMONARY: Non-labored respirations ABDOMEN: Soft and non-tender  MUSCULOSKELETAL: There are no major deformities or cyanosis. NEUROLOGIC: No focal weakness or paresthesias are detected. SKIN: There are no ulcers or rashes noted. PSYCHIATRIC: The patient has a normal affect.  STUDIES:   I have reviewed the following CT scan: *Dilatation of the ascending aorta measuring 4.0 x 3.7 cm proximally and 3.1 x 3.3 cm distally. Stable since prior examination. Continue yearly surveillance with CTA or MRA *Distal thoracoabdominal aorta at the level of the diaphragm measures 3 x 3.6 cm AP and transverse diameter. *Proximal abdominal aorta at the celiac trunk SMA region 2.7 x 3.3 cm. *Mid abdominal aorta 2.4 by 2.8 cm. *Distal abdominal aorta 1.4 x 1.6 cm. *Centrilobular and paraseptal emphysema. *No acute findings in the abdomen or pelvis.  MEDICAL ISSUES:   AAA: Maximum diameter of her abdominal aorta is 3.6 cm at the diaphragm.  I discussed with her that she does not need intervention until it measures 5 cm.  I will plan on getting an ultrasound in 1 year.  I will need to make sure that ultrasound was able to visualize her aorta at the diaphragm.  If they are not unable to do so we may have to think about CT scan or another imaging modality.  Regardless based off the size she is a long way from repair.    Charlena Cross, MD, FACS Vascular and Vein Specialists of [facility] [phone] Pager [phone]

## 2023-06-14 ENCOUNTER — Other Ambulatory Visit: Payer: Self-pay | Admitting: Acute Care

## 2023-06-14 DIAGNOSIS — Z87891 Personal history of nicotine dependence: Secondary | ICD-10-CM

## 2023-06-14 DIAGNOSIS — Z122 Encounter for screening for malignant neoplasm of respiratory organs: Secondary | ICD-10-CM

## 2023-06-20 IMAGING — CT CT CHEST LUNG CANCER SCREENING LOW DOSE W/O CM
1 series · 10 of 10 positions shown, 13 images · non-contrast
Comparison: 04/11/2020.

CLINICAL DATA: Current smoker, 50 pack-year history.



[ct lung segmentation data · axial · 0.66mm/px · z∈[-336,-336]mm · 10 of 311 frames shown]
[frame 1/311  mediastinal]
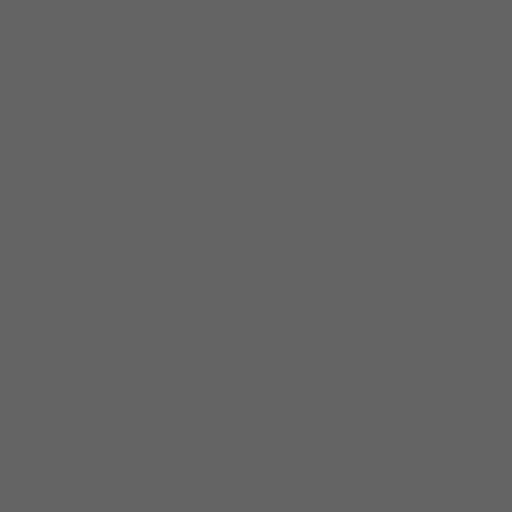
[frame 1/311  lung]
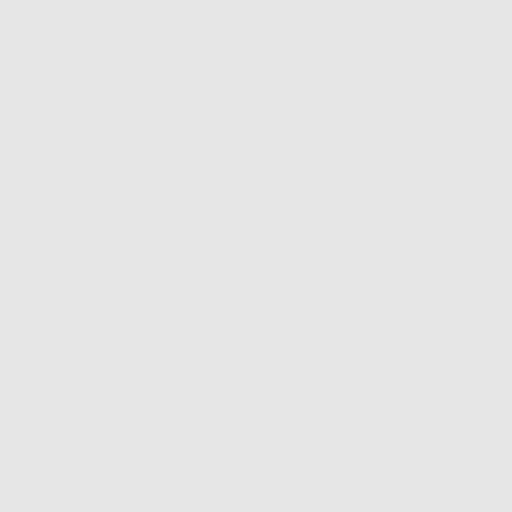
[frame 35/311  lung]
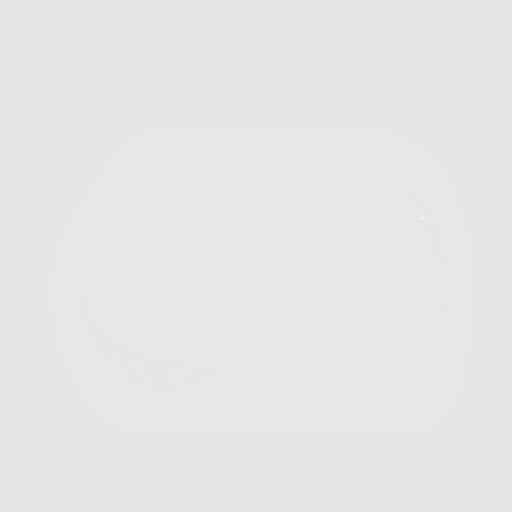
[frame 69/311  lung]
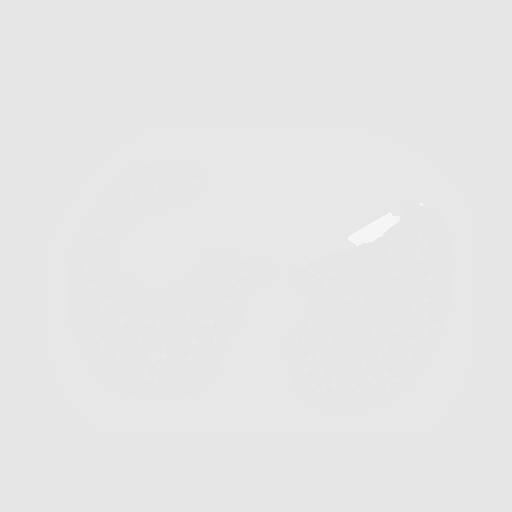
[frame 104/311  lung]
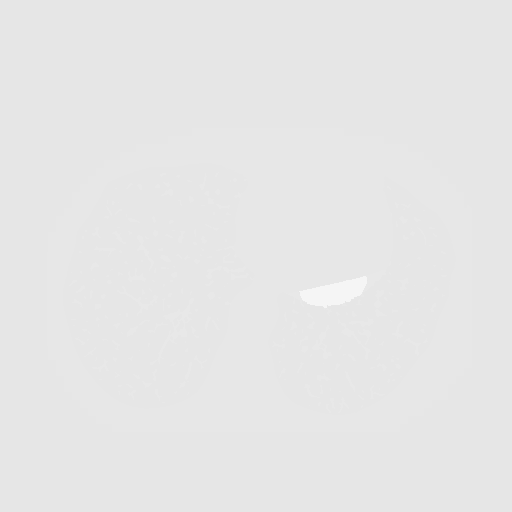
[frame 138/311  mediastinal]
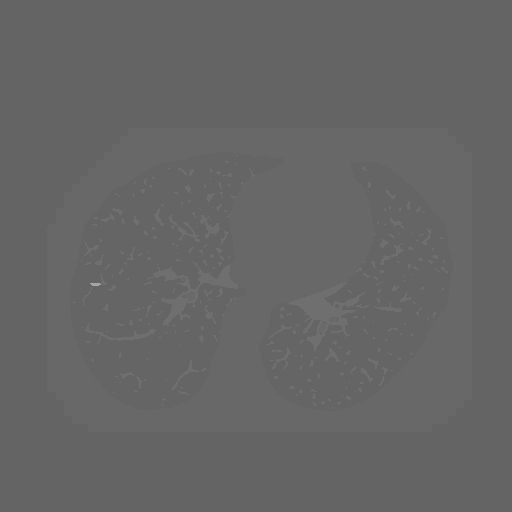
[frame 138/311  lung]
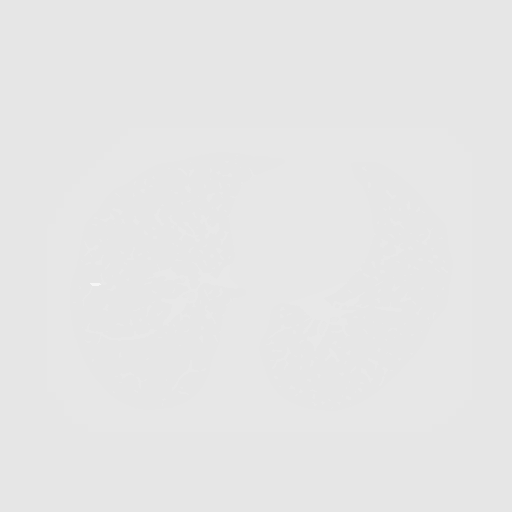
[frame 173/311  lung]
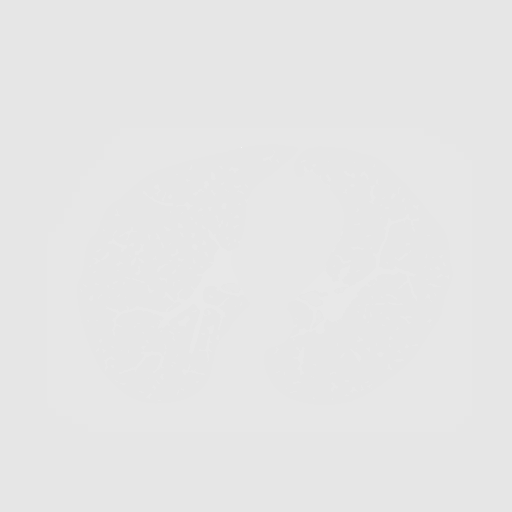
[frame 207/311  lung]
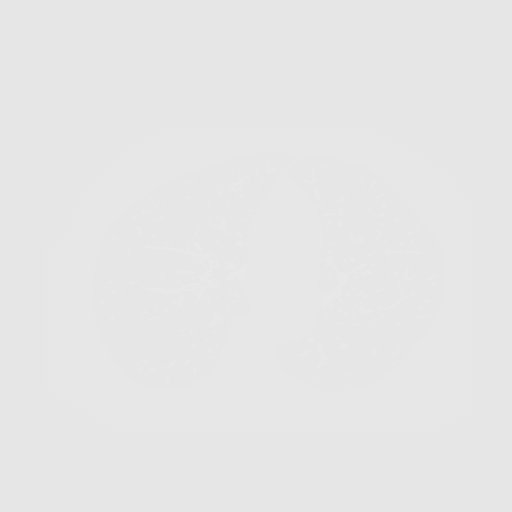
[frame 242/311  lung]
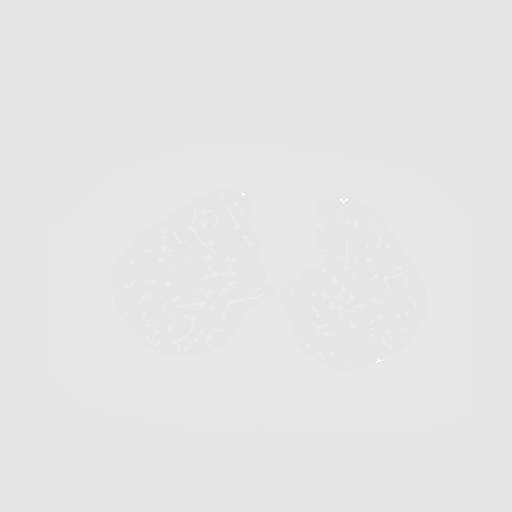
[frame 276/311  mediastinal]
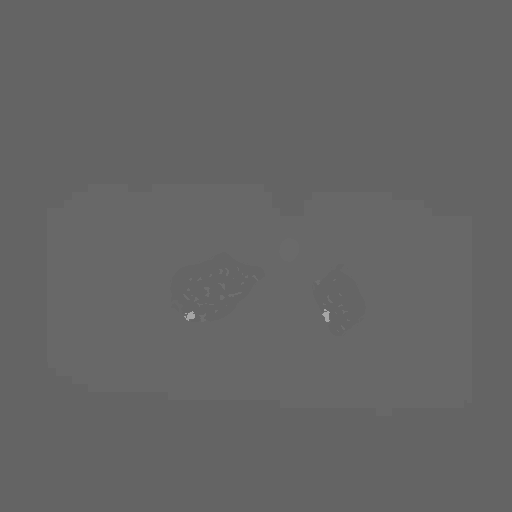
[frame 276/311  lung]
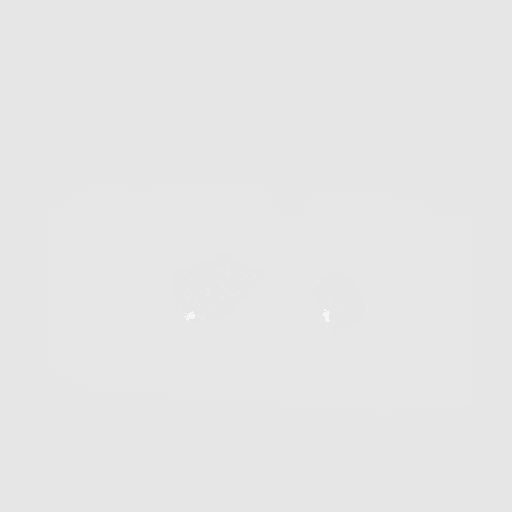
[frame 311/311  lung]
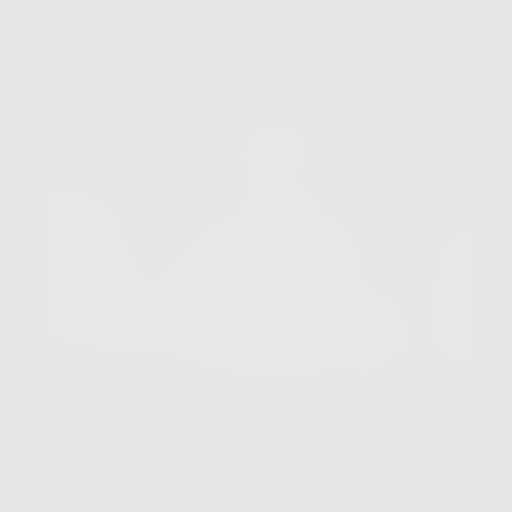

[10 of 10 positions shown; findings below may reference images not displayed]

FINDINGS: Cardiovascular: Atherosclerotic calcification of the aorta, aortic
valve and coronary arteries. Ascending aorta measures up to 4.0 cm.
Heart size normal. No pericardial effusion.

Mediastinum/Nodes: Thyroidectomy. No pathologically enlarged
mediastinal or axillary lymph nodes. Hilar regions are difficult to
definitively evaluate without IV contrast. Esophagus is grossly
unremarkable.

Lungs/Pleura: Biapical pleuroparenchymal scarring. Centrilobular
emphysema. Calcified granulomas. 2.0 mm subpleural right upper lobe
nodule. Previously seen mucoid impaction in the anterior right lower
lobe has resolved. No new or worrisome pulmonary nodules. No pleural
fluid. Airway is unremarkable.

Upper Abdomen: Visualized portions of the liver, adrenal glands,
kidneys, spleen, pancreas, stomach and bowel are grossly
unremarkable.

Musculoskeletal: Degenerative changes in the spine dextroconvex
scoliosis. No worrisome lytic or sclerotic lesions.
IMPRESSION: 1. Lung-RADS 2, benign appearance or behavior. Continue annual
screening with low-dose chest CT without contrast in 12 months.
2. Ascending aortic aneurysm, stable. Recommend annual imaging
followup by CTA or MRA. This recommendation follows 8454
ACCF/AHA/AATS/ACR/ASA/SCA/LIENAD/JIM/BRIAIN/ZANN Guidelines for the
Diagnosis and Management of Patients with Thoracic Aortic Disease.
Circulation. 8454; 121: E266-e369. Aortic aneurysm NOS
(4W8MJ-FNS.F).
3. Aortic atherosclerosis (4W8MJ-NG8.8). Coronary artery
calcification.
4.  Emphysema (4W8MJ-CEF.8).

## 2023-09-21 ENCOUNTER — Other Ambulatory Visit: Payer: Self-pay

## 2023-09-21 MED ORDER — ISOSORBIDE MONONITRATE ER 30 MG PO TB24: 30.0000 mg | ORAL_TABLET | Freq: Every day | ORAL | 0 refills | Status: DC

## 2023-09-24 ENCOUNTER — Other Ambulatory Visit: Payer: Self-pay | Admitting: Family Medicine

## 2023-09-24 DIAGNOSIS — I719 Aortic aneurysm of unspecified site, without rupture: Secondary | ICD-10-CM

## 2023-10-18 ENCOUNTER — Ambulatory Visit
Admission: RE | Admit: 2023-10-18 | Discharge: 2023-10-18 | Disposition: A | Discharge status: Home / Self Care | Source: Ambulatory Visit | Attending: Family Medicine | Admitting: Family Medicine

## 2023-10-18 DIAGNOSIS — I719 Aortic aneurysm of unspecified site, without rupture: Secondary | ICD-10-CM

## 2023-11-01 NOTE — Progress Notes (Unsigned)
 Cardiology Office Note:   Date:  11/02/2023  ID:  [name], DOB 1950/06/06, MRN [id number] PCP:  Auston Opal, DO  [facility] Providers Cardiologist:  Wendel Haws, MD Referring MD: Auston Opal, DO  Chief Complaint/Reason for Referral: Follow-up for coronary artery calcification ASSESSMENT:    1. Coronary artery calcification seen on CAT scan   2. Aortic dilatation (HCC)   3. Hyperlipidemia LDL goal <70   4. Aortic atherosclerosis (HCC)   5. Abdominal aortic aneurysm (AAA) without rupture, unspecified part (HCC)   6. Tobacco abuse     PLAN:   In order of problems listed above: Coronary artery calcification: Continue aspirin  81, atorvastatin  40, Imdur  Aortic dilatation: Chest CT earlier this year demonstrated maximal thoracic aortic dimension of 4.0 cm; obtain CT scan in 1 year. Hyperlipidemia: Check lipid panel, LFTs, and LP(a) today Aortic atherosclerosis: Continue aspirin  81, atorvastatin  40 AAA: Followed by vascular surgery Tobacco abuse: Carotid Dopplers and ABIs in 2024 reassuring.            Dispo:  Return in about 1 year (around 11/01/2024).       I spent 35 minutes reviewing all clinical data during and prior to this visit including all relevant imaging studies, laboratories, clinical information from other health systems and prior notes from both Cardiology and other specialties, interviewing the patient, conducting a complete physical examination, and coordinating care in order to formulate a comprehensive and personalized evaluation and treatment plan.   History of Present Illness:    FOCUSED PROBLEM LIST:   Coronary artery calcification Chest CT 2025 Hyperlipidemia Aortic atherosclerosis Chest CT 2025 Ascending aortic aneurysm 4.0 x 3.7 cm chest CT March 2025 AAA Followed by vascular surgery No aneurysm on chest/abdomen/pelvis CT 2025 Tobacco abuse ABIs WNL 2024 Minimal plaque bilateral carotids ultrasound 2024 BMI 11 May 2021.   The patient was seen for initial consultation regarding coronary artery calcification and occasional chest pain she.  The pain occurred very infrequently.  She was started on Imdur  30 mg.  Given her history of wheezing beta-blocker was deferred.  She is referred for abdominal ultrasound given her history of smoking.   June 2023: The patient is doing well.  She denies any exertional angina.  She denies any significant dyspnea.  She does note in the evening she will get palpitations at times associated with fatigue.  Palpitations do not seem to be associate with chest pain or shortness of breath.  She denies any presyncope or syncope.  She has not required emergency room visits or hospitalizations for breathing or chest pain issues.  She denies any claudication.  Plan:  Continue medication therapy, obtain monitor and chest CT annually in March for thoracic aneurysm monitoring.   July 2024: In the interim the patient had a chest CT which showed ascending thoracic aorta measuring 4.0 cm.  Monitor demonstrated occasional SVT PVCs, and PACs.  The patient tells me she occasionally gets palpitations.  They can happen for 1 minute or 10 minutes.  When offered a beta-blocker she defers therapy for now.  She otherwise denies any significant exertional angina, dyspnea, presyncope, or syncope.  She does develop nuisance bruising while on aspirin .  She has not required emergency room visits or hospitalizations recently.  She [place] weeks at the beach with [place] recently.  Plan: Obtain screening carotids and ABIs given smoking history.  September 2025:  Patient consents to use of AI scribe. The patient returns for routine follow-up.  Current Medications: Current Meds  Medication Sig   aspirin  EC 81 MG tablet Take 1 tablet (81 mg total) by mouth daily. Swallow whole.   atorvastatin  (LIPITOR) 40 MG tablet Take 1 tablet (40 mg total) by mouth daily.   benzonatate  (TESSALON ) 100 MG capsule Take 1 capsule  (100 mg total) by mouth 3 (three) times daily as needed.   Cholecalciferol (VITAMIN D-3) 25 MCG (1000 UT) CAPS Take by mouth.   citalopram (CELEXA) 40 MG tablet Take 40 mg by mouth daily.   denosumab  (PROLIA ) 60 MG/ML SOSY injection Inject 60 mg into the skin every 6 (six) months.   isosorbide  mononitrate (IMDUR ) 30 MG 24 hr tablet Take 1 tablet (30 mg total) by mouth at bedtime.   loratadine (CLARITIN) 10 MG tablet Take 10 mg by mouth daily.   meclizine (ANTIVERT) 25 MG tablet Take 25 mg by mouth 3 (three) times daily as needed for dizziness.   SYNTHROID 100 MCG tablet Take 100 mcg by mouth every morning.     Review of Systems:   Please see the history of present illness.    All other systems reviewed and are negative.     EKGs/Labs/Other Test Reviewed:   EKG: 2024 normal sinus rhythm, BAE  EKG Interpretation Date/Time:  Tuesday November 02 2023 15:49:57 EDT Ventricular Rate:  70 PR Interval:  154 QRS Duration:  72 QT Interval:  400 QTC Calculation: 432 R Axis:   64  Text Interpretation: Sinus rhythm with occasional Premature ventricular complexes When compared with ECG of 03-Sep-2022 07:50, Premature ventricular complexes are now Present Confirmed by Wendel Haws ([id number]) on 11/02/2023 4:17:38 PM        CARDIAC STUDIES: Refer to CV Procedures and Imaging Tabs   Risk Assessment/Calculations:          Physical Exam:   VS:  BP 133/65 (BP Location: Right Arm, Patient Position: Sitting, Cuff Size: Normal)   Pulse 66   Resp 16   Ht 5' 4 (1.626 m)   Wt 108 lb (49 kg)   SpO2 95%   BMI 18.54 kg/m        Wt Readings from Last 3 Encounters:  11/02/23 108 lb (49 kg)  05/10/23 108 lb 8 oz (49.2 kg)  05/03/23 107 lb 11.2 oz (48.9 kg)      GENERAL:  No apparent distress, AOx3 HEENT:  No carotid bruits, +2 carotid impulses, no scleral icterus CAR: RRR no murmurs, gallops, rubs, or thrills RES:  Clear to auscultation bilaterally ABD:  Soft, nontender, nondistended,  positive bowel sounds x 4 VASC:  +2 radial pulses, +2 carotid pulses NEURO:  CN 2-12 grossly intact; motor and sensory grossly intact PSYCH:  No active depression or anxiety EXT:  No edema, ecchymosis, or cyanosis  Signed, Haws MARLA Wendel, MD  11/02/2023 11:47 PM    [facility] [address] [place], [place], [place]  [address] Phone: [phone]; Fax: [phone]   Note:  This document was prepared using Dragon voice recognition software and may include unintentional dictation errors.

## 2023-11-02 ENCOUNTER — Ambulatory Visit: Attending: Internal Medicine | Admitting: Internal Medicine

## 2023-11-02 ENCOUNTER — Encounter: Payer: Self-pay | Admitting: Internal Medicine

## 2023-11-02 VITALS — BP 133/65 | HR 66 | Resp 16 | Ht 64.0 in | Wt 108.0 lb

## 2023-11-02 DIAGNOSIS — E785 Hyperlipidemia, unspecified: Secondary | ICD-10-CM

## 2023-11-02 DIAGNOSIS — I714 Abdominal aortic aneurysm, without rupture, unspecified: Secondary | ICD-10-CM

## 2023-11-02 DIAGNOSIS — I2510 Atherosclerotic heart disease of native coronary artery without angina pectoris: Secondary | ICD-10-CM

## 2023-11-02 DIAGNOSIS — R002 Palpitations: Secondary | ICD-10-CM

## 2023-11-02 DIAGNOSIS — I77819 Aortic ectasia, unspecified site: Secondary | ICD-10-CM

## 2023-11-02 DIAGNOSIS — I700 Atherosclerosis of aorta: Secondary | ICD-10-CM

## 2023-11-02 DIAGNOSIS — Z720 Tobacco use: Secondary | ICD-10-CM

## 2023-11-02 NOTE — Patient Instructions (Signed)
 Medication Instructions:  No medication changes were made at this visit. Continue current regimen.   *If you need a refill on your cardiac medications before your next appointment, please call your pharmacy*  Lab Work: To be completed tomorrow: lipid panel, lipoprotein a, and LFT  If you have labs (blood work) drawn today and your tests are completely normal, you will receive your results only by: MyChart Message (if you have MyChart) OR A paper copy in the mail If you have any lab test that is abnormal or we need to change your treatment, we will call you to review the results.  Testing/Procedures: CTA aorta to be completed in March 2026.  Follow-Up: At [facility], you and your health needs are our priority.  As part of our continuing mission to provide you with exceptional heart care, our providers are all part of one team.  This team includes your primary Cardiologist (physician) and Advanced Practice Providers or APPs (Physician Assistants and Nurse Practitioners) who all work together to provide you with the care you need, when you need it.  Your next appointment:   1 year(s)  Provider:   Lurena Red, MD

## 2023-11-03 MED ORDER — METOPROLOL SUCCINATE ER 25 MG PO TB24: 12.5000 mg | ORAL_TABLET | Freq: Every day | ORAL | 3 refills | Status: AC

## 2023-11-04 ENCOUNTER — Ambulatory Visit: Payer: Self-pay | Admitting: Internal Medicine

## 2023-11-04 LAB — LIPID PANEL
Chol/HDL Ratio: 2.2 ratio (ref 0.0–4.4)
Cholesterol, Total: 134 mg/dL (ref 100–199)
HDL: 62 mg/dL (ref >39)
LDL Chol Calc (NIH): 55 mg/dL (ref 0–99)
Triglycerides: 92 mg/dL (ref 0–149)
VLDL Cholesterol Cal: 17 mg/dL (ref 5–40)

## 2023-11-04 LAB — HEPATIC FUNCTION PANEL
ALT: 10 IU/L (ref 0–32)
AST: 16 IU/L (ref 0–40)
Albumin: 4.4 g/dL (ref 3.8–4.8)
Alkaline Phosphatase: 50 IU/L (ref 44–121)
Bilirubin Total: 0.3 mg/dL (ref 0.0–1.2)
Bilirubin, Direct: 0.15 mg/dL (ref 0.00–0.40)
Total Protein: 6.4 g/dL (ref 6.0–8.5)

## 2023-11-04 LAB — LIPOPROTEIN A (LPA): Lipoprotein (a): 36.1 nmol/L (ref <75.0)

## 2023-11-13 ENCOUNTER — Other Ambulatory Visit: Payer: Self-pay | Admitting: Internal Medicine

## 2024-01-17 ENCOUNTER — Ambulatory Visit: Admitting: Internal Medicine

## 2024-01-25 ENCOUNTER — Ambulatory Visit (INDEPENDENT_AMBULATORY_CARE_PROVIDER_SITE_OTHER)

## 2024-01-25 ENCOUNTER — Other Ambulatory Visit (HOSPITAL_BASED_OUTPATIENT_CLINIC_OR_DEPARTMENT_OTHER): Payer: Self-pay

## 2024-01-25 ENCOUNTER — Other Ambulatory Visit (HOSPITAL_BASED_OUTPATIENT_CLINIC_OR_DEPARTMENT_OTHER): Payer: Self-pay | Admitting: Student

## 2024-01-25 ENCOUNTER — Ambulatory Visit (HOSPITAL_BASED_OUTPATIENT_CLINIC_OR_DEPARTMENT_OTHER): Admitting: Student

## 2024-01-25 DIAGNOSIS — M25551 Pain in right hip: Secondary | ICD-10-CM

## 2024-01-25 DIAGNOSIS — M1611 Unilateral primary osteoarthritis, right hip: Secondary | ICD-10-CM

## 2024-01-25 MED ORDER — LIDOCAINE HCL 1 % IJ SOLN
4.0000 mL | INTRAMUSCULAR | Status: AC | PRN
  Administered 2024-01-25: 4 mL

## 2024-01-25 MED ORDER — TRIAMCINOLONE ACETONIDE 40 MG/ML IJ SUSP
2.0000 mL | INTRAMUSCULAR | Status: AC | PRN
  Administered 2024-01-25: 2 mL via INTRA_ARTICULAR

## 2024-01-25 NOTE — Progress Notes (Signed)
 Chief Complaint: Right hip pain    Discussed the use of AI scribe software for clinical note transcription with the patient, who gave verbal consent to proceed.  History of Present Illness [name] is a 73 year old female who presents with worsening right hip pain.  She has had right hip pain since April of last year without a clear inciting event. An initial greater trochanter injection gave minimal relief and meloxicam was prescribed. A guided intra-articular hip injection in May provided more significant relief. She now has severe right hip pain, worse in the morning when getting out of bed. The pain is mainly in the front and inside of the hip, with some lateral involvement. Movement generally eases the pain, but certain activities worsen it. She has pain radiating down the front of the right leg to the knee, without radiation past the knee. She also has some back soreness.  Denies any numbness or tingling. She currently takes Tylenol  Arthritis. She recently finished meloxicam, which had been helpful, and Advil has not helped.   Surgical History:   None  PMH/PSH/Family History/Social History/Meds/Allergies:    Past Medical History:  Diagnosis Date   Anxiety    Aortic atherosclerosis    CMC arthritis    COPD (chronic obstructive pulmonary disease) (HCC)    Coronary artery calcification seen on CAT scan    Hashimoto's thyroiditis    Hypothyroid    Hypothyroidism    Mood disorder    Osteoarthritis    Osteoporosis    Sciatic leg pain    Tobacco abuse    Past Surgical History:  Procedure Laterality Date   ABDOMINAL HYSTERECTOMY     BREAST CYST EXCISION Bilateral    CERVICAL DISC SURGERY     CHOLECYSTECTOMY     TOTAL THYROIDECTOMY     Social History   Socioeconomic History   Marital status: Married    Spouse name: Not on file   Number of children: Not on file   Years of education: Not on file   Highest education level: Not on  file  Occupational History   Not on file  Tobacco Use   Smoking status: Every Day    Current packs/day: 1.00    Average packs/day: 1 pack/day for 47.0 years (47.0 ttl pk-yrs)    Types: Cigarettes   Smokeless tobacco: Never  Vaping Use   Vaping status: Never Used  Substance and Sexual Activity   Alcohol use: No   Drug use: No   Sexual activity: Not Currently  Other Topics Concern   Not on file  Social History Narrative   Not on file   Social Drivers of Health   Financial Resource Strain: Low Risk  (06/05/2023)   Received from [facility]   Overall Financial Resource Strain (CARDIA)    Difficulty of Paying Living Expenses: Not hard at all  Food Insecurity: No Food Insecurity (06/05/2023)   Received from [facility]   Hunger Vital Sign    Within the past 12 months, you worried that your food would run out before you got the money to buy more.: Never true    Within the past 12 months, the food you bought just didn't last and you didn't have money to get more.: Never true  Transportation Needs: No Transportation Needs (06/05/2023)   Received  from [facility]   PRAPARE - Transportation    Lack of Transportation (Medical): No    Lack of Transportation (Non-Medical): No  Physical Activity: Unknown (06/05/2023)   Received from [facility]   Exercise Vital Sign    On average, how many days per week do you engage in moderate to strenuous exercise (like a brisk walk)?: 0 days    Minutes of Exercise per Session: Not on file  Stress: No Stress Concern Present (06/05/2023)   Received from [facility] - Occupational Stress Questionnaire    Feeling of Stress : Not at all  Social Connections: Socially Integrated (06/05/2023)   Received from [facility]   Social Network    How would you rate your social network (family, work, friends)?: Good participation with social networks   No family history on file. Allergies  Allergen Reactions    Penicillins     Swollen lips, tongue, eyelids.    Chantix [Varenicline]     Other reaction(s): nausea   Neurontin [Gabapentin]    Pseudoephedrine Hcl     Other reaction(s): heart flutters   Current Outpatient Medications  Medication Sig Dispense Refill   aspirin  EC 81 MG tablet Take 1 tablet (81 mg total) by mouth daily. Swallow whole. 90 tablet 3   atorvastatin  (LIPITOR) 40 MG tablet Take 1 tablet (40 mg total) by mouth daily. 90 tablet 3   benzonatate  (TESSALON ) 100 MG capsule Take 1 capsule (100 mg total) by mouth 3 (three) times daily as needed. 30 capsule 0   Cholecalciferol (VITAMIN D-3) 25 MCG (1000 UT) CAPS Take by mouth.     citalopram (CELEXA) 40 MG tablet Take 40 mg by mouth daily.     denosumab  (PROLIA ) 60 MG/ML SOSY injection Inject 60 mg into the skin every 6 (six) months.     isosorbide  mononitrate (IMDUR ) 30 MG 24 hr tablet TAKE 1 TABLET BY MOUTH AT BEDTIME. 90 tablet 3   loratadine (CLARITIN) 10 MG tablet Take 10 mg by mouth daily.     meclizine (ANTIVERT) 25 MG tablet Take 25 mg by mouth 3 (three) times daily as needed for dizziness.     metoprolol  succinate (TOPROL  XL) 25 MG 24 hr tablet Take 0.5 tablets (12.5 mg total) by mouth at bedtime. 45 tablet 3   SYNTHROID 100 MCG tablet Take 100 mcg by mouth every morning.     No current facility-administered medications for this visit.   No results found.  Review of Systems:   A ROS was performed including pertinent positives and negatives as documented in the HPI.  Physical Exam :   Constitutional: NAD and appears stated age Neurological: Alert and oriented Psych: Appropriate affect and cooperative There were no vitals taken for this visit.   Comprehensive Musculoskeletal Exam:    Fluid passive right hip range of motion to 120 degrees flexion, 30 degrees external rotation, and 20 degrees internal rotation with discomfort.  Point tenderness over the greater trochanter.  No lower extremity weakness is noted.  Mild  tenderness in the right lower lumbar region.  Imaging:   Xray (AP pelvis, right hip 3 views): No evidence of acute bony abnormality.  Minimal to mild right hip osteoarthritis.   I personally reviewed and interpreted the radiographs.      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 Imaging reveals mild arthritis and she did undergo an intra-articular hip injection last year which provided great relief.  Offered injection of either the greater trochanter or hip joint for symptom relief.  After discussion, current symptoms suggest the anterior pain is more symptomatic therefore we decided to proceed with ultrasound-guided hip injection.  There was an obstruction in the needle on first attempt which did not allow any flow of the medication, therefore needle was changed and on second attempt the injectate flowed easily into the right hip joint which was visualized on the ultrasound.  The patient tolerated the procedure very well.  Postinjection expectations discussed.  She can follow-up if any pain persists, and could consider a future trochanteric injection if needed.     Procedure Note  Patient: [name]             Date of Birth: 1950-05-15           MRN: [id number]             Visit Date: 01/25/2024  Procedures: Visit Diagnoses:  1. Pain of right hip     Large Joint Inj: R hip joint on 01/25/2024 5:41 PM Indications: pain Details: 18 G 3.5 in needle, ultrasound-guided anterolateral approach Medications: 4 mL lidocaine  1 %; 2 mL triamcinolone  acetonide 40 MG/ML Outcome: tolerated well, no immediate complications Procedure, treatment alternatives, risks and benefits explained, specific risks discussed. Consent was given by the patient. Immediately prior to procedure a time out was called  to verify the correct patient, procedure, equipment, support staff and site/side marked as required. Patient was prepped and draped in the usual sterile fashion.       I personally saw and evaluated the patient, and participated in the management and treatment plan.  Leonce Reveal, PA-C Orthopedics

## 2024-03-08 ENCOUNTER — Telehealth: Admitting: Physician Assistant

## 2024-03-08 DIAGNOSIS — J019 Acute sinusitis, unspecified: Secondary | ICD-10-CM

## 2024-03-08 DIAGNOSIS — B9689 Other specified bacterial agents as the cause of diseases classified elsewhere: Secondary | ICD-10-CM

## 2024-03-08 MED ORDER — DOXYCYCLINE HYCLATE 100 MG PO TABS: 100.0000 mg | ORAL_TABLET | Freq: Two times a day (BID) | ORAL | 0 refills | Status: AC

## 2024-03-08 NOTE — Progress Notes (Signed)

## 2024-03-31 ENCOUNTER — Telehealth: Admitting: Student

## 2024-03-31 DIAGNOSIS — J329 Chronic sinusitis, unspecified: Secondary | ICD-10-CM

## 2024-03-31 MED ORDER — AZITHROMYCIN 250 MG PO TABS: ORAL_TABLET | ORAL | 0 refills | Status: AC

## 2024-03-31 NOTE — Progress Notes (Signed)
"      E-Visit for Sinus Problems  We are sorry that you are not feeling well.  Here is how we plan to help!  Based on what you have shared with me it looks like you have sinusitis.  Sinusitis is inflammation and infection in the sinus cavities of the head.  Based on your presentation I believe you most likely have Acute Bacterial Sinusitis.  This is an infection caused by bacteria and is treated with antibiotics. I have prescribed Azithromycin , which is an antibiotic that you will take for 5 days. You may use an oral decongestant such as Mucinex D or if you have glaucoma or high blood pressure use plain Mucinex. Saline nasal spray help and can safely be used as often as needed for congestion.  If you develop worsening sinus pain, fever or notice severe headache and vision changes, or if symptoms are not better after completion of antibiotic, please schedule an appointment with a health care provider.    Sinus infections are not as easily transmitted as other respiratory infection, however we still recommend that you avoid close contact with loved ones, especially the very young and elderly.  Remember to wash your hands thoroughly throughout the day as this is the number one way to prevent the spread of infection!  Home Care: Only take medications as instructed by your medical team. Complete the entire course of an antibiotic. Do not take these medications with alcohol. A steam or ultrasonic humidifier can help congestion.  You can place a towel over your head and breathe in the steam from hot water coming from a faucet. Avoid close contacts especially the very young and the elderly. Cover your mouth when you cough or sneeze. Always remember to wash your hands.  Get Help Right Away If: You develop worsening fever or sinus pain. You develop a severe head ache or visual changes. Your symptoms persist after you have completed your treatment plan.  Make sure you Understand these instructions. Will  watch your condition. Will get help right away if you are not doing well or get worse.  Your e-visit answers were reviewed by a board certified advanced clinical practitioner to complete your personal care plan.  Depending on the condition, your plan could have included both over the counter or prescription medications.  If there is a problem please reply  once you have received a response from your provider.  Your safety is important to us .  If you have drug allergies check your prescription carefully.    You can use MyChart to ask questions about todays visit, request a non-urgent call back, or ask for a work or school excuse for 24 hours related to this e-Visit. If it has been greater than 24 hours you will need to follow up with your provider, or enter a new e-Visit to address those concerns.  You will get an e-mail in the next two days asking about your experience.  I hope that your e-visit has been valuable and will speed your recovery. Thank you for using e-visits.  I have spent 6 minutes in review of e-visit questionnaire, review and updating patient chart, medical decision making and response to patient.   Leita FORBES Molly, PA-C     "

## 2024-05-01 ENCOUNTER — Other Ambulatory Visit (HOSPITAL_COMMUNITY)

## 2024-05-08 ENCOUNTER — Other Ambulatory Visit

## 2024-05-15 ENCOUNTER — Other Ambulatory Visit (HOSPITAL_COMMUNITY)

## 2024-05-15 ENCOUNTER — Ambulatory Visit
